# Patient Record
Sex: Male | Born: 1975
Health system: Southern US, Community
[De-identification: ages and names within clinical notes are randomized; demographics above are authoritative.]

## PROBLEM LIST (undated history)

## (undated) DIAGNOSIS — IMO0001 Reserved for inherently not codable concepts without codable children: Secondary | ICD-10-CM

## (undated) DIAGNOSIS — K219 Gastro-esophageal reflux disease without esophagitis: Secondary | ICD-10-CM

## (undated) DIAGNOSIS — R030 Elevated blood-pressure reading, without diagnosis of hypertension: Secondary | ICD-10-CM

## (undated) DIAGNOSIS — E119 Type 2 diabetes mellitus without complications: Secondary | ICD-10-CM

## (undated) DIAGNOSIS — U071 COVID-19: Secondary | ICD-10-CM

## (undated) HISTORY — DX: COVID-19: U07.1

## (undated) HISTORY — PX: NO PAST SURGERIES: SHX2092

---

## 2001-06-09 ENCOUNTER — Encounter: Payer: Self-pay | Admitting: Family Medicine

## 2001-06-09 ENCOUNTER — Ambulatory Visit (HOSPITAL_COMMUNITY): Admission: RE | Admit: 2001-06-09 | Discharge: 2001-06-09 | Payer: Self-pay | Admitting: Family Medicine

## 2003-09-20 ENCOUNTER — Emergency Department (HOSPITAL_COMMUNITY): Admission: EM | Admit: 2003-09-20 | Discharge: 2003-09-20 | Payer: Self-pay | Admitting: Emergency Medicine

## 2008-10-09 ENCOUNTER — Emergency Department (HOSPITAL_BASED_OUTPATIENT_CLINIC_OR_DEPARTMENT_OTHER): Admission: EM | Admit: 2008-10-09 | Discharge: 2008-10-09 | Payer: Self-pay | Admitting: Emergency Medicine

## 2009-11-20 ENCOUNTER — Ambulatory Visit: Payer: Self-pay | Admitting: Occupational Medicine

## 2009-11-20 DIAGNOSIS — K219 Gastro-esophageal reflux disease without esophagitis: Secondary | ICD-10-CM | POA: Insufficient documentation

## 2009-11-20 DIAGNOSIS — R079 Chest pain, unspecified: Secondary | ICD-10-CM | POA: Insufficient documentation

## 2009-11-20 DIAGNOSIS — I10 Essential (primary) hypertension: Secondary | ICD-10-CM | POA: Insufficient documentation

## 2010-05-29 ENCOUNTER — Ambulatory Visit: Payer: Self-pay | Admitting: Family Medicine

## 2010-05-29 DIAGNOSIS — M545 Low back pain: Secondary | ICD-10-CM

## 2011-01-29 NOTE — Letter (Signed)
Summary: Out of Work  [facility]  [address]   [place], [place] [address]   Phone: [phone]  Fax: [phone]    May 29, 2010   Employee:  ADVITH MARTINE [facility]    To Whom It May Concern:   For Medical reasons, the above named employee should avoid lifting (not over 10 pounds), pushing, pulling, and straining for one week.     If you need additional information, please feel free to contact our office.         Sincerely,    Donna Christen MD

## 2011-01-29 NOTE — Assessment & Plan Note (Signed)
Summary: BACK PAIN   Vital Signs:  Patient Profile:   35 Years Old Male CC:      Lower back pain, no trauma, walking across room and felt sharp pain in back Height:     68.5 inches Weight:      158 pounds O2 Sat:      100 % O2 treatment:    Room Air Temp:     98.1 degrees F oral Pulse rate:   80 / minute Pulse rhythm:   regular Resp:     12 per minute BP sitting:   145 / 92  (right arm) Cuff size:   regular  Vitals Entered By: Emilio Math (May 29, 2010 12:30 PM)                  Current Allergies (reviewed today): ! * LATEXHistory of Present Illness Chief Complaint: Lower back pain, no trauma, walking across room and felt sharp pain in back History of Present Illness: Subjective:  Patient complains of low back pain. About 3 weeks ago while sitting in a chair waiting for his car to be repaired, he stood up suddenly and felt sudden lower back pain that did not radiate.  The pain lasted only one day.   One week ago, he stood up suddenly and felt recurrent right low back pain that lasted about 4 hours.  Yesterday while walking on a flat surface he again developed mild low back pain that has peristed.  The pain is better when he walks and sits, worse when standing from a chair.  No bowel or bladder dysfunction.  No saddle numbness.  No past history of back pain.  Feels well.  No weight loss.  No fevers, chills, and sweats   Current Meds NEXIUM 40 MG CPDR (ESOMEPRAZOLE MAGNESIUM)  NAPROXEN 375 MG TABS (NAPROXEN) 1 by mouth q12hr pc  REVIEW OF SYSTEMS Constitutional Symptoms      Denies fever, chills, night sweats, weight loss, weight gain, and fatigue.  Eyes       Denies change in vision, eye pain, eye discharge, glasses, contact lenses, and eye surgery. Ear/Nose/Throat/Mouth       Denies hearing loss/aids, change in hearing, ear pain, ear discharge, dizziness, frequent runny nose, frequent nose bleeds, sinus problems, sore throat, hoarseness, and tooth pain or bleeding.    Respiratory       Denies dry cough, productive cough, wheezing, shortness of breath, asthma, bronchitis, and emphysema/COPD.  Cardiovascular       Denies murmurs, chest pain, and tires easily with exhertion.    Gastrointestinal       Denies stomach pain, nausea/vomiting, diarrhea, constipation, blood in bowel movements, and indigestion. Genitourniary       Denies painful urination, kidney stones, and loss of urinary control. Neurological       Denies paralysis, seizures, and fainting/blackouts. Musculoskeletal       Complains of muscle pain, joint pain, and decreased range of motion.      Denies joint stiffness, redness, swelling, muscle weakness, and gout.  Skin       Denies bruising, unusual mles/lumps or sores, and hair/skin or nail changes.  Psych       Denies mood changes, temper/anger issues, anxiety/stress, speech problems, depression, and sleep problems.  Past History:  Past Medical History: Reviewed history from 11/20/2009 and no changes required. GERD Hypertension  Past Surgical History: Reviewed history from 11/20/2009 and no changes required. Endoscopy 2009  Family History: Reviewed history from 11/20/2009 and no  changes required. Mother,Asthma, HTN Father, HTN, Parkinson's Disease  Social History: Reviewed history from 11/20/2009 and no changes required. Non-smoker Alcohol use-yes Drug use-no Regular exercise-yes IT Manager   Objective:  Appearance:  Patient appears healthy, stated age, and in no acute distress  Eyes:  Pupils are equal, round, and reactive to light and accomdation.  Extraocular movement is intact.  Conjunctivae are not inflamed.  Neck:  No adenopathy Lungs:  Clear to auscultation.  Breath sounds are equal.  Heart:  Regular rate and rhythm without murmurs, rubs, or gallops.  Abdomen:  Nontender without masses or hepatosplenomegaly.  Bowel sounds are present.  No CVA or flank tenderness.   Back:  Full range of motion.  Can heel/toe walk  and squat without difficulty.   Tenderness in the midline at L5-S1.    Straight leg raising test is negative.  Sitting knee extension test is negative.  Strength and sensation in the lower extremities is normal.  Patellar and achilles reflexes are normal.  X-ray LS spine no acute changes X-ray sacrum/coccyx negative Assessment New Problems: BACK PAIN, LUMBAR (ICD-724.2)   Plan New Medications/Changes: NAPROXEN 375 MG TABS (NAPROXEN) 1 by mouth q12hr pc  #20 x 0, 05/29/2010, Donna Christen MD  New Orders: T-Lumbar Spine 2 Views [72100TC] T-Coccyx/Sacrum 2 Views [72220TC] Est. Patient Level III [47829] Planning Comments:   Begin back exercises ([employer] information and instruction patient handout given).  Rx for Naproxen. Follow-up with PCP if not improving.   The patient and/or caregiver has been counseled thoroughly with regard to medications prescribed including dosage, schedule, interactions, rationale for use, and possible side effects and they verbalize understanding.  Diagnoses and expected course of recovery discussed and will return if not improved as expected or if the condition worsens. Patient and/or caregiver verbalized understanding.  Prescriptions: NAPROXEN 375 MG TABS (NAPROXEN) 1 by mouth q12hr pc  #20 x 0   Entered and Authorized by:   Donna Christen MD   Signed by:   Donna Christen MD on 05/29/2010   Method used:   Print then Give to Patient   RxID:   [id number]

## 2012-09-24 ENCOUNTER — Emergency Department
Admission: EM | Admit: 2012-09-24 | Discharge: 2012-09-24 | Disposition: A | Discharge status: Home / Self Care | Payer: BC Managed Care – PPO | Source: Home / Self Care | Attending: Family Medicine | Admitting: Family Medicine

## 2012-09-24 ENCOUNTER — Encounter: Payer: Self-pay | Admitting: *Deleted

## 2012-09-24 DIAGNOSIS — R51 Headache: Secondary | ICD-10-CM

## 2012-09-24 DIAGNOSIS — I10 Essential (primary) hypertension: Secondary | ICD-10-CM

## 2012-09-24 HISTORY — DX: Elevated blood-pressure reading, without diagnosis of hypertension: R03.0

## 2012-09-24 HISTORY — DX: Reserved for inherently not codable concepts without codable children: IMO0001

## 2012-09-24 HISTORY — DX: Gastro-esophageal reflux disease without esophagitis: K21.9

## 2012-09-24 MED ORDER — SUMATRIPTAN 5 MG/ACT NA SOLN: NASAL | Status: DC

## 2012-09-24 NOTE — ED Provider Notes (Signed)
History     CSN: [id number]  Arrival date & time 09/24/12  1034   First MD Initiated Contact with Patient 09/24/12 1048      Chief Complaint  Patient presents with  . Headache     HPI Comments: Patient complains of five day history of recurring lancinating right side headaches.  Each brief pain lasts only about a second, and the episodes of recurring pain last about 30 minutes usually in the morning.  After resolution, he has a vague sore area in his right lateral head that slowly resolves.  The headache episodes sometimes recur later in the day.  No nausea/vomiting.  No changes in vision.  No other neuro symptoms.  The headaches do not awaken him.  He had similar headaches about 4 years ago and a CT scan of head was negative.  His PCP prescribed Imitrex with complete resolution of the headaches. He is assymptomatic at present.  Patient notes a family history of hypertension  Patient is a 36 y.o. male presenting with headaches. The history is provided by the patient.  Headache The primary symptoms include headaches. Primary symptoms do not include syncope, loss of consciousness, altered mental status, seizures, dizziness, visual change, paresthesias, focal weakness, loss of sensation, speech change, memory loss, fever, nausea or vomiting. Episode onset: 5 days ago. The symptoms are unchanged. The neurological symptoms are focal.  The headache is not associated with aura, photophobia, visual change, neck stiffness, paresthesias, weakness or loss of balance.  Additional symptoms include pain and hyperacusis. Additional symptoms do not include neck stiffness, weakness, lower back pain, loss of balance, photophobia, aura, taste disturbance, hearing loss, tinnitus or vertigo. Associated medical issues comments: none. Workup history does not include CT scan.    Past Medical History  Diagnosis Date  . Reflux   . Pre-hypertension     History reviewed. No pertinent past surgical  history.  Family History  Problem Relation Age of Onset  . Hypertension Mother   . Hypertension Father   . Parkinsonism Father     History  Substance Use Topics  . Smoking status: Never Smoker   . Smokeless tobacco: Not on file  . Alcohol Use: Yes      Review of Systems  Constitutional: Negative for fever.  HENT: Negative for hearing loss, neck stiffness and tinnitus.   Eyes: Negative for photophobia.  Cardiovascular: Negative for syncope.  Gastrointestinal: Negative for nausea and vomiting.  Neurological: Positive for headaches. Negative for dizziness, vertigo, speech change, focal weakness, seizures, loss of consciousness, weakness, paresthesias and loss of balance.  Psychiatric/Behavioral: Negative for memory loss and altered mental status.  All other systems reviewed and are negative.    Allergies  Latex and Shellfish allergy  Home Medications  No current outpatient prescriptions on file.  BP 151/103  Pulse 90  Resp 16  Ht 5\' 8"  (1.727 m)  Wt 202 lb (91.627 kg)  BMI 30.71 kg/m2  SpO2 99%  Physical Exam Nursing notes and Vital Signs reviewed. Appearance:  Patient appears healthy, stated age, and in no acute distress Eyes:  Pupils are equal, round, and reactive to light and accomodation.  Extraocular movement is intact.  Conjunctivae are not inflamed.  Fundi are benign.  No photophobia.  Ears:  Canals normal.  Tympanic membranes normal.  Nose:  Mildly congested turbinates.  No sinus tenderness.  Pharynx:  Normal Neck:  Supple.  No adenopathy Lungs:  Clear to auscultation.  Breath sounds are equal.  Heart:  Regular rate and  rhythm without murmurs, rubs, or gallops.  Skin:  No rash present.  Neurologic:  Cranial nerves 2 through 12 are normal.  Patellar, achilles, and elbow reflexes are normal.  Cerebellar function is intact (finger-to-nose and rapid alternating hand movement).  Gait and station are normal.    ED Course  Procedures  none      1.  Unilateral headache; ? Migraine.  Note previous negative CT scan head 4 years ago.  2. Essential hypertension, benign  Chart reviewed:  Note significant weight gain.  On 05/29/10 BP was 158 pounds, and BP was 145/92.      MDM  Trial of Imitrex. Recommend keeping a blood pressure diary.  Discussed exercise, minimize salt, weight loss. Followup with family doctor for blood pressure and headaches.        Lattie Haw, MD 09/24/12 1233

## 2012-09-24 NOTE — ED Notes (Addendum)
Patient c/o intermittent sharp pain on right occipital/temporal region of head x 5 days. The sharp pain only lasts for a few seconds, then resolves completely. He had this same issue that lasted one week about 5 years ago. CT scan done and patient reports was negative. He denies any weakness, dizziness, vision changes or nausea when pain occurs.

## 2012-09-26 ENCOUNTER — Telehealth: Payer: Self-pay

## 2012-09-26 NOTE — ED Notes (Signed)
Left a message on voice mail asking how patient is feeling and advising to call back with any questions or concerns.  

## 2016-01-10 ENCOUNTER — Encounter: Payer: Self-pay | Admitting: *Deleted

## 2016-01-10 ENCOUNTER — Emergency Department
Admission: EM | Admit: 2016-01-10 | Discharge: 2016-01-10 | Disposition: A | Discharge status: Home / Self Care | Payer: Self-pay | Source: Home / Self Care | Attending: Family Medicine | Admitting: Family Medicine

## 2016-01-10 DIAGNOSIS — S0501XA Injury of conjunctiva and corneal abrasion without foreign body, right eye, initial encounter: Secondary | ICD-10-CM

## 2016-01-10 HISTORY — DX: Type 2 diabetes mellitus without complications: E11.9

## 2016-01-10 MED ORDER — POLYMYXIN B-TRIMETHOPRIM 10000-0.1 UNIT/ML-% OP SOLN: 1.0000 [drp] | OPHTHALMIC | Status: DC

## 2016-01-10 NOTE — ED Provider Notes (Signed)
CSN: [id number]     Arrival date & time 01/10/16  1508 History   First MD Initiated Contact with Patient 01/10/16 1602     Chief Complaint  Patient presents with  . Eye Problem      HPI Comments: Patient wear contact lens.  On December 29 he noted redness in his right eye.  After not wearing his contacts for four days the redness resolved.  He resumed wearing his contacts, and six days ago he developed a foreign body sensation in his right eye.  Even though he has not worn his contacts during the past six days he continues to have a slight foreign body sensation, redness, and increased tearing in his right eye.  No changes in vision.  He notes that a cold compress helps.  Patient is a 40 y.o. male presenting with eye problem. The history is provided by the patient.  Eye Problem Location:  R eye Quality:  Foreign body sensation Severity:  Mild Onset quality:  Sudden Duration:  6 days Timing:  Constant Progression:  Unchanged Chronicity:  Recurrent Context: contact lenses   Context: not direct trauma, not foreign body and not scratch   Relieved by: cold compress. Worsened by:  Contact lenses Associated symptoms: foreign body sensation, redness and tearing   Associated symptoms: no blurred vision, no crusting, no decreased vision, no discharge, no double vision, no facial rash, no headaches, no inflammation, no itching, no photophobia, no scotomas and no swelling   Risk factors: not exposed to pinkeye     Past Medical History  Diagnosis Date  . Reflux   . Pre-hypertension   . Diabetes mellitus without complication (HCC)    History reviewed. No pertinent past surgical history. Family History  Problem Relation Age of Onset  . Hypertension Mother   . Hypertension Father   . Parkinsonism Father    Social History  Substance Use Topics  . Smoking status: Never Smoker   . Smokeless tobacco: None  . Alcohol Use: Yes    Review of Systems  Eyes: Positive for redness. Negative for  blurred vision, double vision, photophobia, discharge and itching.  Neurological: Negative for headaches.  All other systems reviewed and are negative.   Allergies  Latex and Shellfish allergy  Home Medications   Prior to Admission medications   Medication Sig Start Date End Date Taking? Authorizing Provider  trimethoprim-polymyxin b (POLYTRIM) ophthalmic solution Place 1 drop into the right eye every 4 (four) hours. 01/10/16   Lattie Haw, MD   Meds Ordered and Administered this Visit  Medications - No data to display  BP 173/111 mmHg  Pulse 95  Temp(Src) 98.2 F (36.8 C) (Oral)  Resp 16  Wt 198 lb (89.812 kg)  SpO2 99% No data found.   Physical Exam  Constitutional: He appears well-developed and well-nourished. No distress.  HENT:  Head: Normocephalic.  Right Ear: External ear normal.  Left Ear: External ear normal.  Nose: Nose normal.  Mouth/Throat: Oropharynx is clear and moist.  Eyes: EOM and lids are normal. Pupils are equal, round, and reactive to light. Lids are everted and swept, no foreign bodies found. Right eye exhibits no discharge, no exudate and no hordeolum. No foreign body present in the right eye. Left eye exhibits no discharge. Right conjunctiva is injected. Right conjunctiva has no hemorrhage.    Fluorescein to the right eye reveals a linear superficial corneal abrasion at position 10 o'clock as noted on diagram.    Lymphadenopathy:  He has no cervical adenopathy.  Nursing note and vitals reviewed.   ED Course  Procedures  none  Visual Acuity Review  Right Eye Distance: 20/25 Left Eye Distance: 20/20 Bilateral Distance: 20/20    MDM   1. Right corneal abrasion, initial encounter    Begin Polytrim ophthalmic suspension Followup with ophthalmologist in two days.    Lattie HawStephen A Beese, MD 01/11/16 [id number]

## 2016-01-10 NOTE — Discharge Instructions (Signed)
Corneal Abrasion °The cornea is the clear covering at the front and center of the eye. When looking at the colored portion of the eye (iris), you are looking through the cornea. This very thin tissue is made up of many layers. The surface layer is a single layer of cells (corneal epithelium) and is one of the most sensitive tissues in the body. If a scratch or injury causes the corneal epithelium to come off, it is called a corneal abrasion. If the injury extends to the tissues below the epithelium, the condition is called a corneal ulcer. °CAUSES  °· Scratches. °· Trauma. °· Foreign body in the eye. °Some people have recurrences of abrasions in the area of the original injury even after it has healed (recurrent erosion syndrome). Recurrent erosion syndrome generally improves and goes away with time. °SYMPTOMS  °· Eye pain. °· Difficulty or inability to keep the injured eye open. °· The eye becomes very sensitive to light. °· Recurrent erosions tend to happen suddenly, first thing in the morning, usually after waking up and opening the eye. °DIAGNOSIS  °Your health care provider can diagnose a corneal abrasion during an eye exam. Dye is usually placed in the eye using a drop or a small paper strip moistened by your tears. When the eye is examined with a special light, the abrasion shows up clearly because of the dye. °TREATMENT  °· Small abrasions may be treated with antibiotic drops or ointment alone. °If the abrasion becomes infected and spreads to the deeper tissues of the cornea, a corneal ulcer can result. This is serious because it can cause corneal scarring. Corneal scars interfere with light passing through the cornea and cause a loss of vision in the involved eye. °HOME CARE INSTRUCTIONS °· Use medicine or ointment as directed. Only take over-the-counter or prescription medicines for pain, discomfort, or fever as directed by your health care provider. °· If your health care provider has given you a  follow-up appointment, it is very important to keep that appointment. Not keeping the appointment could result in a severe eye infection or permanent loss of vision. If there is any problem keeping the appointment, let your health care provider know. °SEEK MEDICAL CARE IF:  °· You have pain, light sensitivity, and a scratchy feeling in one eye or both eyes. °· Any kind of discharge develops from the eye after treatment or if the lids stick together in the morning. °· You have the same symptoms in the morning as you did with the original abrasion days, weeks, or months after the abrasion healed. °  °This information is not intended to replace advice given to you by your health care provider. Make sure you discuss any questions you have with your health care provider. °  °Document Released: 12/13/2000 Document Revised: 09/06/2015 Document Reviewed: 08/23/2013 °Elsevier Interactive Patient Education ©2016 [employer] ° °

## 2016-01-10 NOTE — ED Notes (Signed)
Pt c/o 6 days of right eye redness and irritation. No drainage. Right eye redness developed 12/18/15 and resolved the next day.

## 2019-06-03 ENCOUNTER — Other Ambulatory Visit: Payer: Self-pay

## 2019-06-03 ENCOUNTER — Emergency Department (INDEPENDENT_AMBULATORY_CARE_PROVIDER_SITE_OTHER): Payer: Self-pay

## 2019-06-03 ENCOUNTER — Emergency Department
Admission: EM | Admit: 2019-06-03 | Discharge: 2019-06-03 | Disposition: A | Discharge status: Home / Self Care | Payer: Self-pay | Source: Home / Self Care

## 2019-06-03 DIAGNOSIS — R030 Elevated blood-pressure reading, without diagnosis of hypertension: Secondary | ICD-10-CM

## 2019-06-03 DIAGNOSIS — R000 Tachycardia, unspecified: Secondary | ICD-10-CM

## 2019-06-03 DIAGNOSIS — M940 Chondrocostal junction syndrome [Tietze]: Secondary | ICD-10-CM

## 2019-06-03 DIAGNOSIS — R0989 Other specified symptoms and signs involving the circulatory and respiratory systems: Secondary | ICD-10-CM

## 2019-06-03 DIAGNOSIS — K219 Gastro-esophageal reflux disease without esophagitis: Secondary | ICD-10-CM

## 2019-06-03 LAB — POCT FASTING CBG KUC MANUAL ENTRY: POCT Glucose (KUC): 105 mg/dL — AB (ref 70–99)

## 2019-06-03 NOTE — ED Triage Notes (Signed)
A week ago at work, patient fell over a box and landed flat on his stomach/chest against the floor.  Since has felt pressure/knot in upper abdomen/chest area.

## 2019-06-03 NOTE — Discharge Instructions (Signed)
°  Your EKG showed a slightly elevated heart rate but otherwise reassuring.   Your blood pressure was high throughout your entire visit in urgent care as well as previous visits at this urgent care in the past.  It is strongly advised you discuss this with your primary care provider as you may need to be on at least a low dose of blood pressure medication.  Untreated high blood pressure along with borderline diabetes puts you at an increased risk for early death from heart disease/heart attack, stroke, and even kidney failure.  Please call your family doctor tomorrow to schedule a follow up appointment by early next week for recheck of your symptoms and blood pressure.  Call 911 or have someone drive you to the closest hospital if new or worsening symptoms develop for further evaluation and treatment of symptoms.

## 2019-06-03 NOTE — ED Provider Notes (Addendum)
[facility]    CSN: [id number] Arrival date & time: 06/03/19  1644     History   Chief Complaint Chief Complaint  Patient presents with  . Chest Injury    HPI Robert Glover is a 43 y.o. male.   HPI  Robert Glover is a 43 y.o. male presenting to UC with c/o centralized chest "discomfort" since trip and fall onto laminate floor on May 28th. Mild discomfort does improve with  junior Advil, which he has taken the last 2-3 days. Hx of acid reflux. He has tried OTC GasX and started Nexium today with no relief but states it typically takes 5 days of being on the medication for his indigestion to resolve. Denies chest pain at this time. Denies SOB, diaphoresis or nausea. Pain is not worse with movement. He did have Left elbow and Right knee pain initially from the fall but that pain has resolved.   Pt reports hx of diabetes but he has not gotten his metformin refilled since last winter. Pt states his PCP left the practice but he has seen another PA in the same practice but it has been "a while." he does plan on establishing care with a new PCP. he does monitor his blood sugar at home daily and occasionally after meals. It is never above 140/150 after means and typically between 90-110 prior to meals.  BP elevated in triage. Pt reports hx of "pre-hypertension" but he has never been on medication for his blood pressure.    HR elevated in triage.  Pt reports being anxious at the doctor but notes his HR has been in the 70s the last few days since onset of discomfort.  Pt denies hx of blood clots. Denies calf pain or swelling. No recent travel or surgeries. Denies SOB.  Past Medical History:  Diagnosis Date  . Diabetes mellitus without complication (HCC)   . Pre-hypertension   . Reflux     Patient Active Problem List   Diagnosis Date Noted  . BACK PAIN, LUMBAR 05/29/2010  . HYPERTENSION 11/20/2009  . GERD 11/20/2009  . CHEST PAIN UNSPECIFIED 11/20/2009     History reviewed. No pertinent surgical history.     Home Medications    Prior to Admission medications   Medication Sig Start Date End Date Taking? Authorizing Provider  trimethoprim-polymyxin b (POLYTRIM) ophthalmic solution Place 1 drop into the right eye every 4 (four) hours. 01/10/16   Lattie Haw, MD    Family History Family History  Problem Relation Age of Onset  . Hypertension Mother   . Hypertension Father   . Parkinsonism Father     Social History Social History   Tobacco Use  . Smoking status: Never Smoker  Substance Use Topics  . Alcohol use: Yes  . Drug use: No     Allergies   Patient has no known allergies.   Review of Systems Review of Systems  Constitutional: Negative for chills, diaphoresis, fatigue and fever.  HENT: Negative for congestion, ear pain, sore throat, trouble swallowing and voice change.   Respiratory: Negative for cough, chest tightness and shortness of breath.   Cardiovascular: Positive for chest pain ( "discomfort"). Negative for palpitations.  Gastrointestinal: Negative for abdominal pain, diarrhea, nausea and vomiting.  Musculoskeletal: Negative for arthralgias, back pain and myalgias.  Skin: Negative for rash.     Physical Exam  No data found.  Updated Vital Signs BP (!) 157/106   Pulse 100   Temp 98.3 F (36.8  C) (Tympanic)   Resp 20   Ht 5\' 8"  (1.727 m)   Wt 195 lb (88.5 kg)   SpO2 99%   BMI 29.65 kg/m   Visual Acuity Right Eye Distance:   Left Eye Distance:   Bilateral Distance:    Right Eye Near:   Left Eye Near:    Bilateral Near:     Physical Exam Vitals signs and nursing note reviewed.  Constitutional:      General: He is not in acute distress.    Appearance: Normal appearance. He is well-developed. He is not ill-appearing, toxic-appearing or diaphoretic.  HENT:     Head: Normocephalic and atraumatic.     Nose: Nose normal.     Mouth/Throat:     Mouth: Mucous membranes are moist.  Eyes:      Extraocular Movements: Extraocular movements intact.     Conjunctiva/sclera: Conjunctivae normal.  Neck:     Musculoskeletal: Normal range of motion and neck supple.  Cardiovascular:     Rate and Rhythm: Regular rhythm. Tachycardia present.  Pulmonary:     Effort: Pulmonary effort is normal. No respiratory distress.     Breath sounds: Normal breath sounds. No stridor. No wheezing, rhonchi or rales.  Chest:     Chest wall: Tenderness ( minimal over lower sternum. no crepitus or deformity. ) present.  Abdominal:     General: There is no distension.     Palpations: Abdomen is soft.     Tenderness: There is no abdominal tenderness.  Musculoskeletal: Normal range of motion.  Skin:    General: Skin is warm and dry.     Capillary Refill: Capillary refill takes less than 2 seconds.  Neurological:     Mental Status: He is alert and oriented to person, place, and time.  Psychiatric:        Behavior: Behavior normal.      UC Treatments / Results  Labs (all labs ordered are listed, but only abnormal results are displayed) Labs Reviewed  POCT FASTING CBG [facility] MANUAL ENTRY - Abnormal; Notable for the following components:      Result Value   POCT Glucose ([facility]) 105 (*)    All other components within normal limits    EKG Date/Time:06/03/2019    Ventricular Rate: 107 PR Interval: 116 QRS Duration: 80 QT Interval: 340 QTC Calculation: 453 P-R-T axes: 62   43   13 Text Interpretation: Sinus tachycardia, possible Left atrial enlargement. Borderline ECG.     Radiology Dg Chest 2 View  Result Date: 06/03/2019 CLINICAL DATA:  Fall.  Pressure in chest. EXAM: CHEST - 2 VIEW COMPARISON:  Prior chest x-ray report 04/18/2007. FINDINGS: Mediastinum and hilar structures normal. Lungs are clear. No pleural effusion or pneumothorax. Biapical pleural thickening consistent with scarring. Heart size normal. No acute bony abnormality. IMPRESSION: No acute cardiopulmonary disease. No evidence  of displaced rib fracture. No pneumothorax. Electronically Signed   By: Maisie Fushomas  Register   On: 06/03/2019 17:30    Procedures Procedures (including critical care time)  Medications Ordered in UC Medications - No data to display  Initial Impression / Assessment and Plan / UC Course  I have reviewed the triage vital signs and the nursing notes.  Pertinent labs & imaging results that were available during my care of the patient were reviewed by me and considered in my medical decision making (see chart for details).    While in imaging, pt was asked to reach over his head and take a deep breath.  Pt noted immediately after doing this, he felt relief of the discomfort he was having and notes he wish he had done that earlier.  Reassured pt of relatively unremarkable EKG and CXR Doubt ACS Hx and exam c/w costochondritis and GERD Home care info provided, including stressed importance of BP monitoring and maintenance by PCP (per medical records, pt has had elevated BP in the past while at [facility]) Pt was initially tachycardic but HR improved while in UC. Doubt PE. Pt plans to establish with PCP at [facility]. Discussed symptoms that warrant emergent care in the ED.  Final Clinical Impressions(s) / UC Diagnoses   Final diagnoses:  Costochondritis  Gastroesophageal reflux disease, esophagitis presence not specified  Elevated blood pressure reading  Tachycardia     Discharge Instructions      Your EKG showed a slightly elevated heart rate but otherwise reassuring.   Your blood pressure was high throughout your entire visit in urgent care as well as previous visits at this urgent care in the past.  It is strongly advised you discuss this with your primary care provider as you may need to be on at least a low dose of blood pressure medication.  Untreated high blood pressure along with borderline diabetes puts you at an increased risk for early death from heart  disease/heart attack, stroke, and even kidney failure.  Please call your family doctor tomorrow to schedule a follow up appointment by early next week for recheck of your symptoms and blood pressure.  Call 911 or have someone drive you to the closest hospital if new or worsening symptoms develop for further evaluation and treatment of symptoms.     ED Prescriptions    None     Controlled Substance Prescriptions [place] Controlled Substance Registry consulted? Not Applicable   Rolla Plate 06/03/19 1821    Lurene Shadow, PA-C 06/03/19 2216

## 2019-06-06 ENCOUNTER — Telehealth: Payer: Self-pay

## 2019-06-06 NOTE — Telephone Encounter (Signed)
Left voice message inquiring about patients status. Encouraged patient to call with questions or concerns.  

## 2020-01-21 ENCOUNTER — Ambulatory Visit (INDEPENDENT_AMBULATORY_CARE_PROVIDER_SITE_OTHER): Payer: 59 | Admitting: Medical-Surgical

## 2020-01-21 ENCOUNTER — Encounter: Payer: Self-pay | Admitting: Medical-Surgical

## 2020-01-21 ENCOUNTER — Other Ambulatory Visit: Payer: Self-pay

## 2020-01-21 VITALS — BP 183/108 | HR 98 | Temp 98.6°F | Ht 66.75 in | Wt 182.9 lb

## 2020-01-21 DIAGNOSIS — E119 Type 2 diabetes mellitus without complications: Secondary | ICD-10-CM | POA: Diagnosis not present

## 2020-01-21 DIAGNOSIS — Z7689 Persons encountering health services in other specified circumstances: Secondary | ICD-10-CM | POA: Diagnosis not present

## 2020-01-21 DIAGNOSIS — R030 Elevated blood-pressure reading, without diagnosis of hypertension: Secondary | ICD-10-CM

## 2020-01-21 DIAGNOSIS — Z23 Encounter for immunization: Secondary | ICD-10-CM | POA: Diagnosis not present

## 2020-01-21 DIAGNOSIS — I10 Essential (primary) hypertension: Secondary | ICD-10-CM | POA: Diagnosis not present

## 2020-01-21 MED ORDER — LISINOPRIL-HYDROCHLOROTHIAZIDE 20-25 MG PO TABS: 1.0000 | ORAL_TABLET | Freq: Every day | ORAL | 3 refills | Status: DC

## 2020-01-21 MED ORDER — LISINOPRIL-HYDROCHLOROTHIAZIDE 10-12.5 MG PO TABS: 1.0000 | ORAL_TABLET | Freq: Every day | ORAL | 3 refills | Status: DC

## 2020-01-21 MED ORDER — ATORVASTATIN CALCIUM 20 MG PO TABS: 20.0000 mg | ORAL_TABLET | Freq: Every day | ORAL | 3 refills | Status: DC

## 2020-01-21 NOTE — Assessment & Plan Note (Addendum)
Checking CBC, CMP, and TSH today.  Starting lisinopril-HCTZ 20-25 mg, take one half tab daily x1 week then increase to 1 whole tab.  Encouraged to obtain blood pressure cuff and monitor blood pressure at home.  Information on DASH diet provided.

## 2020-01-21 NOTE — Assessment & Plan Note (Signed)
Checking hemoglobin A1c today.  Starting atorvastatin 20 mg daily.

## 2020-01-21 NOTE — Patient Instructions (Signed)
DASH Eating Plan DASH stands for "Dietary Approaches to Stop Hypertension." The DASH eating plan is a healthy eating plan that has been shown to reduce high blood pressure (hypertension). It may also reduce your risk for type 2 diabetes, heart disease, and stroke. The DASH eating plan may also help with weight loss. What are tips for following this plan?  General guidelines  Avoid eating more than 2,300 mg (milligrams) of salt (sodium) a day. If you have hypertension, you may need to reduce your sodium intake to 1,500 mg a day.  Limit alcohol intake to no more than 1 drink a day for nonpregnant women and 2 drinks a day for men. One drink equals 12 oz of beer, 5 oz of wine, or 1 oz of hard liquor.  Work with your health care provider to maintain a healthy body weight or to lose weight. Ask what an ideal weight is for you.  Get at least 30 minutes of exercise that causes your heart to beat faster (aerobic exercise) most days of the week. Activities may include walking, swimming, or biking.  Work with your health care provider or diet and nutrition specialist (dietitian) to adjust your eating plan to your individual calorie needs. Reading food labels   Check food labels for the amount of sodium per serving. Choose foods with less than 5 percent of the Daily Value of sodium. Generally, foods with less than 300 mg of sodium per serving fit into this eating plan.  To find whole grains, look for the word "whole" as the first word in the ingredient list. Shopping  Buy products labeled as "low-sodium" or "no salt added."  Buy fresh foods. Avoid canned foods and premade or frozen meals. Cooking  Avoid adding salt when cooking. Use salt-free seasonings or herbs instead of table salt or sea salt. Check with your health care provider or pharmacist before using salt substitutes.  Do not fry foods. Cook foods using healthy methods such as baking, boiling, grilling, and broiling instead.  Cook with  heart-healthy oils, such as olive, canola, soybean, or sunflower oil. Meal planning  Eat a balanced diet that includes: ? 5 or more servings of fruits and vegetables each day. At each meal, try to fill half of your plate with fruits and vegetables. ? Up to 6-8 servings of whole grains each day. ? Less than 6 oz of lean meat, poultry, or fish each day. A 3-oz serving of meat is about the same size as a deck of cards. One egg equals 1 oz. ? 2 servings of low-fat dairy each day. ? A serving of nuts, seeds, or beans 5 times each week. ? Heart-healthy fats. Healthy fats called Omega-3 fatty acids are found in foods such as flaxseeds and coldwater fish, like sardines, salmon, and mackerel.  Limit how much you eat of the following: ? Canned or prepackaged foods. ? Food that is high in trans fat, such as fried foods. ? Food that is high in saturated fat, such as fatty meat. ? Sweets, desserts, sugary drinks, and other foods with added sugar. ? Full-fat dairy products.  Do not salt foods before eating.  Try to eat at least 2 vegetarian meals each week.  Eat more home-cooked food and less restaurant, buffet, and fast food.  When eating at a restaurant, ask that your food be prepared with less salt or no salt, if possible. What foods are recommended? The items listed may not be a complete list. Talk with your dietitian about   what dietary choices are best for you. Grains Whole-grain or whole-wheat bread. Whole-grain or whole-wheat pasta. Brown rice. Oatmeal. Quinoa. Bulgur. Whole-grain and low-sodium cereals. Pita bread. Low-fat, low-sodium crackers. Whole-wheat flour tortillas. Vegetables Fresh or frozen vegetables (raw, steamed, roasted, or grilled). Low-sodium or reduced-sodium tomato and vegetable juice. Low-sodium or reduced-sodium tomato sauce and tomato paste. Low-sodium or reduced-sodium canned vegetables. Fruits All fresh, dried, or frozen fruit. Canned fruit in natural juice (without  added sugar). Meat and other protein foods Skinless chicken or turkey. Ground chicken or turkey. Pork with fat trimmed off. Fish and seafood. Egg whites. Dried beans, peas, or lentils. Unsalted nuts, nut butters, and seeds. Unsalted canned beans. Lean cuts of beef with fat trimmed off. Low-sodium, lean deli meat. Dairy Low-fat (1%) or fat-free (skim) milk. Fat-free, low-fat, or reduced-fat cheeses. Nonfat, low-sodium ricotta or cottage cheese. Low-fat or nonfat yogurt. Low-fat, low-sodium cheese. Fats and oils Soft margarine without trans fats. Vegetable oil. Low-fat, reduced-fat, or light mayonnaise and salad dressings (reduced-sodium). Canola, safflower, olive, soybean, and sunflower oils. Avocado. Seasoning and other foods Herbs. Spices. Seasoning mixes without salt. Unsalted popcorn and pretzels. Fat-free sweets. What foods are not recommended? The items listed may not be a complete list. Talk with your dietitian about what dietary choices are best for you. Grains Baked goods made with fat, such as croissants, muffins, or some breads. Dry pasta or rice meal packs. Vegetables Creamed or fried vegetables. Vegetables in a cheese sauce. Regular canned vegetables (not low-sodium or reduced-sodium). Regular canned tomato sauce and paste (not low-sodium or reduced-sodium). Regular tomato and vegetable juice (not low-sodium or reduced-sodium). Pickles. Olives. Fruits Canned fruit in a light or heavy syrup. Fried fruit. Fruit in cream or butter sauce. Meat and other protein foods Fatty cuts of meat. Ribs. Fried meat. Bacon. Sausage. Bologna and other processed lunch meats. Salami. Fatback. Hotdogs. Bratwurst. Salted nuts and seeds. Canned beans with added salt. Canned or smoked fish. Whole eggs or egg yolks. Chicken or turkey with skin. Dairy Whole or 2% milk, cream, and half-and-half. Whole or full-fat cream cheese. Whole-fat or sweetened yogurt. Full-fat cheese. Nondairy creamers. Whipped toppings.  Processed cheese and cheese spreads. Fats and oils Butter. Stick margarine. Lard. Shortening. Ghee. Bacon fat. Tropical oils, such as coconut, palm kernel, or palm oil. Seasoning and other foods Salted popcorn and pretzels. Onion salt, garlic salt, seasoned salt, table salt, and sea salt. Worcestershire sauce. Tartar sauce. Barbecue sauce. Teriyaki sauce. Soy sauce, including reduced-sodium. Steak sauce. Canned and packaged gravies. Fish sauce. Oyster sauce. Cocktail sauce. Horseradish that you find on the shelf. Ketchup. Mustard. Meat flavorings and tenderizers. Bouillon cubes. Hot sauce and Tabasco sauce. Premade or packaged marinades. Premade or packaged taco seasonings. Relishes. Regular salad dressings. Where to find more information:  National Heart, Lung, and Blood Institute: www.nhlbi.nih.gov  American Heart Association: www.heart.org Summary  The DASH eating plan is a healthy eating plan that has been shown to reduce high blood pressure (hypertension). It may also reduce your risk for type 2 diabetes, heart disease, and stroke.  With the DASH eating plan, you should limit salt (sodium) intake to 2,300 mg a day. If you have hypertension, you may need to reduce your sodium intake to 1,500 mg a day.  When on the DASH eating plan, aim to eat more fresh fruits and vegetables, whole grains, lean proteins, low-fat dairy, and heart-healthy fats.  Work with your health care provider or diet and nutrition specialist (dietitian) to adjust your eating plan to your   individual calorie needs. This information is not intended to replace advice given to you by your health care provider. Make sure you discuss any questions you have with your health care provider. Document Revised: 11/28/2017 Document Reviewed: 12/09/2016 Elsevier Patient Education  2020 Elsevier Inc.  

## 2020-01-21 NOTE — Progress Notes (Addendum)
New Patient Office Visit  Subjective:  Patient ID: Robert Glover, male    DOB: 05/05/1976  Age: 44 y.o. MRN: [id number]  CC:  Chief Complaint  Patient presents with  . Establish Care  . Diabetes  . Elevated Blood Pressure withou Dx of HTN    HPI Robert Glover is a pleasant 44 year old male presenting today to establish care. No primary care management for at least 5 years.  Has significant difficulty swallowing pills.  Either uses children's formula Tylenol/ibuprofen or if unavailable in liquid form, uses apple babyfood to swallow smaller pills.  History of diabetes, managed currently by diet.  Checking blood sugars 1-2 times per day, over the last week ranging between 90s and 155.  Previously tried to take Metformin but the pill was too big and he could not swallow it.  Avoids concentrated sweets and tries to be conscientious with his dietary choices.  Usually exercises regularly when the weather is good.  Decreased exercise recently due to the cold temperatures.  No previous ACE inhibitor or statin therapy.  Blood pressure 206/128 today.  Recheck after appointment completion was 183/119.  Has been told in the past that his blood pressure was elevated but was never treated for hypertension.  Denies chest pain, shortness of breath, cavitations, lower extremity edema, headaches, and lightheadedness.  Feels that he has significant whitecoat syndrome and doctors offices make him very nervous.  Reports he has had chest soreness over the sternum and lower ribs intermittently for several weeks.  Admits to digging with a shovel to bury his cat with the soreness presenting the next morning.  Has taken Advil which helps relieve the discomfort.  Past Medical History:  Diagnosis Date  . COVID-19   . Diabetes mellitus without complication (HCC)   . Pre-hypertension   . Reflux     Past Surgical History:  Procedure Laterality Date  . NO PAST SURGERIES      Family History  Problem  Relation Age of Onset  . Hypertension Mother   . Hypertension Father   . Parkinsonism Father   . Cancer Neg Hx   . COPD Neg Hx   . Depression Neg Hx   . Hyperlipidemia Neg Hx   . Kidney disease Neg Hx   . Stroke Neg Hx     Social History   Socioeconomic History  . Marital status: Single    Spouse name: Not on file  . Number of children: Not on file  . Years of education: Not on file  . Highest education level: Not on file  Occupational History  . Occupation: Automotive engineer  Tobacco Use  . Smoking status: Never Smoker  . Smokeless tobacco: Never Used  Substance and Sexual Activity  . Alcohol use: Yes    Comment: Rarely  . Drug use: No  . Sexual activity: Never  Other Topics Concern  . Not on file  Social History Narrative  . Not on file   Social Determinants of Health   Financial Resource Strain:   . Difficulty of Paying Living Expenses: Not on file  Food Insecurity:   . Worried About Programme researcher, broadcasting/film/video in the Last Year: Not on file  . Ran Out of Food in the Last Year: Not on file  Transportation Needs:   . Lack of Transportation (Medical): Not on file  . Lack of Transportation (Non-Medical): Not on file  Physical Activity:   . Days of Exercise per Week: Not on file  . Minutes of  Exercise per Session: Not on file  Stress:   . Feeling of Stress : Not on file  Social Connections:   . Frequency of Communication with Friends and Family: Not on file  . Frequency of Social Gatherings with Friends and Family: Not on file  . Attends Religious Services: Not on file  . Active Member of Clubs or Organizations: Not on file  . Attends Banker Meetings: Not on file  . Marital Status: Not on file  Intimate Partner Violence:   . Fear of Current or Ex-Partner: Not on file  . Emotionally Abused: Not on file  . Physically Abused: Not on file  . Sexually Abused: Not on file    ROS Review of Systems  Constitutional: Negative for chills, fatigue and fever.   Respiratory: Negative for cough, chest tightness and shortness of breath.   Cardiovascular: Negative for chest pain, palpitations and leg swelling.  Gastrointestinal: Negative for abdominal pain, constipation, diarrhea and nausea.  Endocrine: Negative for polydipsia, polyphagia and polyuria.  Neurological: Negative for dizziness, weakness and light-headedness.  Psychiatric/Behavioral: Negative for decreased concentration and suicidal ideas. The patient is nervous/anxious.     Objective:   Today's Vitals: BP (!) 183/108   Pulse 98   Temp 98.6 F (37 C) (Oral)   Ht 5' 6.75" (1.695 m)   Wt 182 lb 14.4 oz (83 kg)   SpO2 100%   BMI 28.86 kg/m   Physical Exam Constitutional:      General: He is not in acute distress.    Appearance: Normal appearance.  HENT:     Head: Normocephalic and atraumatic.  Cardiovascular:     Rate and Rhythm: Normal rate and regular rhythm.     Pulses: Normal pulses.     Heart sounds: No murmur. No friction rub. No gallop.   Pulmonary:     Effort: Pulmonary effort is normal. No respiratory distress.     Breath sounds: Normal breath sounds.  Musculoskeletal:        General: Tenderness (Mid/lower sternum) present.  Skin:    General: Skin is warm and dry.  Neurological:     Mental Status: He is alert and oriented to person, place, and time.  Psychiatric:        Mood and Affect: Mood normal.        Behavior: Behavior normal.        Thought Content: Thought content normal.        Judgment: Judgment normal.     Assessment & Plan:   Problem List Items Addressed This Visit      Endocrine   Diabetes mellitus without complication (HCC)   Relevant Medications   atorvastatin (LIPITOR) 20 MG tablet   lisinopril-hydrochlorothiazide (ZESTORETIC) 20-25 MG tablet   Other Relevant Orders   CBC   CMP and Liver   TSH   HgB A1c     Other   Elevated blood pressure reading   Relevant Medications   lisinopril-hydrochlorothiazide (ZESTORETIC) 20-25 MG  tablet   Encounter to establish care - Primary     Costochondritis May take ibuprofen as needed.  Avoid activities that exacerbate discomfort.   Outpatient Encounter Medications as of 01/21/2020  Medication Sig  . esomeprazole (NEXIUM) 20 MG capsule Take 20 mg by mouth daily as needed.  Marland Kitchen atorvastatin (LIPITOR) 20 MG tablet Take 1 tablet (20 mg total) by mouth daily.  Marland Kitchen lisinopril-hydrochlorothiazide (ZESTORETIC) 20-25 MG tablet Take 1 tablet by mouth daily. Take 1/2 tab daily for 1 week  then increase to 1 tab daily.  . [DISCONTINUED] lisinopril-hydrochlorothiazide (ZESTORETIC) 10-12.5 MG tablet Take 1 tablet by mouth daily.  . [DISCONTINUED] lisinopril-hydrochlorothiazide (ZESTORETIC) 20-25 MG tablet Take 1 tablet by mouth daily. Take 1/2 tab daily for 1 week then increase to 1 tab daily.  . [DISCONTINUED] trimethoprim-polymyxin b (POLYTRIM) ophthalmic solution Place 1 drop into the right eye every 4 (four) hours.   No facility-administered encounter medications on file as of 01/21/2020.    Follow-up: Return in about 2 weeks (around 02/04/2020) for nurse visit for BP check.   Samuel Bouche, NP

## 2020-01-25 LAB — HEMOGLOBIN A1C

## 2020-01-25 LAB — TSH

## 2020-01-25 LAB — CBC

## 2020-01-25 LAB — CP 307569

## 2020-02-04 ENCOUNTER — Ambulatory Visit (INDEPENDENT_AMBULATORY_CARE_PROVIDER_SITE_OTHER): Payer: 59 | Admitting: Medical-Surgical

## 2020-02-04 ENCOUNTER — Other Ambulatory Visit: Payer: Self-pay

## 2020-02-04 VITALS — BP 173/119 | HR 92 | Temp 98.0°F | Wt 184.1 lb

## 2020-02-04 DIAGNOSIS — I10 Essential (primary) hypertension: Secondary | ICD-10-CM | POA: Diagnosis not present

## 2020-02-04 NOTE — Progress Notes (Signed)
Patient is here for a blood pressure check. Denies chest pains, palpitations, dizziness, lightheadedness or SOB. Patient currently taking blood pressure medications as instructed. Patient mentioned during blood pressure reading he has white coat syndrome. As per patient, blood pressure readings at home has been ranging from upper 130's/80's. Last at home check was yesterday - 135/83. Per provider's instruction, patient aware to make an appt in 2 weeks for blood pressure check. Patient will purchase a machine and will record bp readings every other day. Aware to bring in blood readings and machine at next nurse visit.

## 2020-02-18 ENCOUNTER — Ambulatory Visit (INDEPENDENT_AMBULATORY_CARE_PROVIDER_SITE_OTHER): Payer: 59 | Admitting: Nurse Practitioner

## 2020-02-18 ENCOUNTER — Other Ambulatory Visit: Payer: Self-pay

## 2020-02-18 ENCOUNTER — Other Ambulatory Visit: Payer: Self-pay | Admitting: Nurse Practitioner

## 2020-02-18 VITALS — BP 158/94 | HR 89 | Wt 183.1 lb

## 2020-02-18 DIAGNOSIS — I10 Essential (primary) hypertension: Secondary | ICD-10-CM

## 2020-02-18 DIAGNOSIS — E119 Type 2 diabetes mellitus without complications: Secondary | ICD-10-CM

## 2020-02-18 MED ORDER — LISINOPRIL-HYDROCHLOROTHIAZIDE 20-12.5 MG PO TABS: 2.0000 | ORAL_TABLET | Freq: Every day | ORAL | 1 refills | Status: DC

## 2020-02-18 NOTE — Progress Notes (Signed)
Pt is here for a NV blood pressure check. Denies chest pain, palpitations, SOB, lightheadedness, ha's, mood or medication problems. Initial blood pressure reading was 175/108, pulse 91. Pt presented with personal blood pressure monitor along with copies of home blood pressure readings. Blood pressure reading with pt's machine was 167/100, pulse 92. Pt sat for 15 minutes. Recheck reading was 158/94, pulse 89. Covering provider informed of blood pressure readings. Covering provider has made adjustments to pt's blood pressure medication. Pt is aware of changes. Pt informed to continue with home blood pressure checks and to follow up with nurse for blood pressure check with new dose. Pt was agreeable with plan.

## 2020-03-03 ENCOUNTER — Other Ambulatory Visit: Payer: Self-pay

## 2020-03-03 ENCOUNTER — Ambulatory Visit (INDEPENDENT_AMBULATORY_CARE_PROVIDER_SITE_OTHER): Payer: 59 | Admitting: Medical-Surgical

## 2020-03-03 VITALS — BP 137/89 | HR 83 | Wt 183.0 lb

## 2020-03-03 DIAGNOSIS — I10 Essential (primary) hypertension: Secondary | ICD-10-CM | POA: Diagnosis not present

## 2020-03-03 NOTE — Progress Notes (Signed)
   Subjective:    Patient ID: Robert Glover, male    DOB: [birth year], 44 y.o.   MRN: [id number]  HPI Patient here for recheck on BP.  Home Bp readings are as follows:  134/76 128/75 133/75 130/72 135/71 No complaints of headaches, dizziness, blurred vision, chest pain, palpitations or shortness of breath. KG LPN  Review of Systems     Objective:   Physical Exam        Assessment & Plan:  Continue current medication and follow up with office visit in 2 months or sooner if blood pressures start to rise. KG LPN

## 2020-03-04 ENCOUNTER — Encounter: Payer: Self-pay | Admitting: Nurse Practitioner

## 2020-03-06 ENCOUNTER — Other Ambulatory Visit: Payer: Self-pay

## 2020-03-06 DIAGNOSIS — I10 Essential (primary) hypertension: Secondary | ICD-10-CM

## 2020-03-06 MED ORDER — LISINOPRIL-HYDROCHLOROTHIAZIDE 20-12.5 MG PO TABS: 2.0000 | ORAL_TABLET | Freq: Every day | ORAL | 1 refills | Status: DC

## 2020-04-10 ENCOUNTER — Other Ambulatory Visit: Payer: Self-pay

## 2020-04-10 ENCOUNTER — Emergency Department
Admission: EM | Admit: 2020-04-10 | Discharge: 2020-04-10 | Disposition: A | Discharge status: Home / Self Care | Payer: 59 | Source: Home / Self Care

## 2020-04-10 DIAGNOSIS — K625 Hemorrhage of anus and rectum: Secondary | ICD-10-CM | POA: Diagnosis not present

## 2020-04-10 DIAGNOSIS — K648 Other hemorrhoids: Secondary | ICD-10-CM

## 2020-04-10 NOTE — ED Triage Notes (Signed)
Patient presents to Urgent Care with complaints of small amount of blood with his stool since this morning. Patient reports he had a large BM and that started the bleeding, which has been intermittent since the BM. Pt describes the blood as a light shade of red.

## 2020-04-10 NOTE — ED Provider Notes (Signed)
[name]    CSN: [id number] Arrival date & time: 04/10/20  1359      History   Chief Complaint Chief Complaint  Patient presents with  . Rectal Bleeding    HPI Robert Glover is a 44 y.o. male.   HPI Robert Glover is a 44 y.o. male presenting to UC with c/o rectal bleeding with a small amount of bright red blood after having a "large" bowel movement this morning.  He reports continuing to have a small amount of red blood when he wiped every 30 minutes this morning while at work but states the blood was never so much that it got onto his clothing.  Hx of similar symptoms after larger BMs but states the bleeding is usually just after the BM and not continuous after the initial wipe.   Denies fever, chills, n/v/d. No abdominal pain.  No known family hx of colon cancer. Pt f/u routinely with his PCP for ongoing healthcare needs including HTN.    Past Medical History:  Diagnosis Date  . COVID-19   . Diabetes mellitus without complication (HCC)   . Pre-hypertension   . Reflux     Patient Active Problem List   Diagnosis Date Noted  . Diabetes mellitus without complication (HCC) 01/21/2020  . BACK PAIN, LUMBAR 05/29/2010  . Essential hypertension 11/20/2009  . GERD 11/20/2009  . CHEST PAIN UNSPECIFIED 11/20/2009    Past Surgical History:  Procedure Laterality Date  . NO PAST SURGERIES         Home Medications    Prior to Admission medications   Medication Sig Start Date End Date Taking? Authorizing Provider  atorvastatin (LIPITOR) 20 MG tablet Take 1 tablet (20 mg total) by mouth daily. 01/21/20  Yes Christen Butter, NP  lisinopril-hydrochlorothiazide (ZESTORETIC) 20-12.5 MG tablet Take 2 tablets by mouth daily. For blood pressure 03/06/20  Yes Jessup, Joy, NP  esomeprazole (NEXIUM) 20 MG capsule Take 20 mg by mouth daily as needed.    [provider]    Family History Family History  Problem Relation Age of Onset  . Hypertension  Mother   . Hypertension Father   . Parkinsonism Father   . Cancer Neg Hx   . COPD Neg Hx   . Depression Neg Hx   . Hyperlipidemia Neg Hx   . Kidney disease Neg Hx   . Stroke Neg Hx     Social History Social History   Tobacco Use  . Smoking status: Never Smoker  . Smokeless tobacco: Never Used  Substance Use Topics  . Alcohol use: Yes    Comment: Rarely  . Drug use: No     Allergies   Latex   Review of Systems Review of Systems  Constitutional: Negative for chills and fever.  Gastrointestinal: Positive for anal bleeding and blood in stool. Negative for abdominal distention, abdominal pain, constipation, diarrhea, nausea, rectal pain and vomiting.  Genitourinary: Negative for dysuria, flank pain, frequency and hematuria.  Neurological: Negative for dizziness and headaches.     Physical Exam Triage Vital Signs ED Triage Vitals  Enc Vitals Group     BP 04/10/20 1422 (!) 152/94     Pulse Rate 04/10/20 1422 94     Resp 04/10/20 1422 16     Temp 04/10/20 1422 98.8 F (37.1 C)     Temp Source 04/10/20 1422 Oral     SpO2 04/10/20 1422 99 %     Weight --  Height --      Head Circumference --      Peak Flow --      Pain Score 04/10/20 1421 0     Pain Loc --      Pain Edu? --      Excl. in [place]? --    No data found.  Updated Vital Signs BP (!) 152/94 (BP Location: Right Arm)   Pulse 94   Temp 98.8 F (37.1 C) (Oral)   Resp 16   SpO2 99%   Visual Acuity Right Eye Distance:   Left Eye Distance:   Bilateral Distance:    Right Eye Near:   Left Eye Near:    Bilateral Near:     Physical Exam Vitals and nursing note reviewed. Exam conducted with a chaperone present.  Constitutional:      Appearance: Normal appearance. He is well-developed.  HENT:     Head: Normocephalic and atraumatic.  Cardiovascular:     Rate and Rhythm: Normal rate.  Pulmonary:     Effort: Pulmonary effort is normal.  Genitourinary:    Rectum: Guaiac result positive. Internal  hemorrhoid present. No tenderness, anal fissure or external hemorrhoid. Normal anal tone.     Comments: Small amount of dried red blood on buttock c/w prior wiping of blood. No visible active bleeding.  Musculoskeletal:        General: Normal range of motion.     Cervical back: Normal range of motion.  Skin:    General: Skin is warm and dry.  Neurological:     Mental Status: He is alert and oriented to person, place, and time.  Psychiatric:        Behavior: Behavior normal.      UC Treatments / Results  Labs (all labs ordered are listed, but only abnormal results are displayed) Labs Reviewed - No data to display  EKG   Radiology No results found.  Procedures Procedures (including critical care time)  Medications Ordered in UC Medications - No data to display  Initial Impression / Assessment and Plan / UC Course  I have reviewed the triage vital signs and the nursing notes.  Pertinent labs & imaging results that were available during my care of the patient were reviewed by me and considered in my medical decision making (see chart for details).     Small amount of dried blood noted on exam. Small internal hemorrhoid palpated on exam No evidence of emergent process taking place at this time. Pt reports having rectal suppositories he's used in the past for hemorrhoids, advised he can try these today. Encouraged f/u with PCP or GI specialist if symptoms continue.   Pt plans to go to [facility] for a week starting Saturday, 4/17.  Discussed symptoms that warrant emergent care in the ED. AVS provided  Final Clinical Impressions(s) / UC Diagnoses   Final diagnoses:  Rectal bleeding  Internal hemorrhoid     Discharge Instructions      It sounds as if your bleeding has resolved/nearly resolved.  You may try your home suppositories for a small internal hemorrhoid felt on exam.  Be sure to monitor the bleeding. If it persists this week or resolves and keeps returning over  the next few days or weeks, please follow up with your family doctor who would be able to schedule a colonoscopy or call to schedule a follow up appointment with [facility] for further evaluation and treatment of your symptoms.  Call 911 or go to  the hospital if symptoms worsening- worsening bleeding, abdominal pain, dizziness, passing out or other new concerning symptoms develop.    ED Prescriptions    None     PDMP not reviewed this encounter.   Lurene Shadow, [place] 04/11/20 [phone]

## 2020-04-10 NOTE — Discharge Instructions (Addendum)
  It sounds as if your bleeding has resolved/nearly resolved.  You may try your home suppositories for a small internal hemorrhoid felt on exam.  Be sure to monitor the bleeding. If it persists this week or resolves and keeps returning over the next few days or weeks, please follow up with your family doctor who would be able to schedule a colonoscopy or call to schedule a follow up appointment with [facility] for further evaluation and treatment of your symptoms.  Call 911 or go to the hospital if symptoms worsening- worsening bleeding, abdominal pain, dizziness, passing out or other new concerning symptoms develop.

## 2020-04-28 ENCOUNTER — Other Ambulatory Visit: Payer: Self-pay

## 2020-04-28 ENCOUNTER — Encounter: Payer: Self-pay | Admitting: Medical-Surgical

## 2020-04-28 ENCOUNTER — Ambulatory Visit (INDEPENDENT_AMBULATORY_CARE_PROVIDER_SITE_OTHER): Payer: 59 | Admitting: Medical-Surgical

## 2020-04-28 VITALS — BP 133/88 | HR 84 | Temp 97.9°F | Ht 66.75 in | Wt 183.5 lb

## 2020-04-28 DIAGNOSIS — I10 Essential (primary) hypertension: Secondary | ICD-10-CM | POA: Diagnosis not present

## 2020-04-28 DIAGNOSIS — E119 Type 2 diabetes mellitus without complications: Secondary | ICD-10-CM | POA: Diagnosis not present

## 2020-04-28 DIAGNOSIS — Z23 Encounter for immunization: Secondary | ICD-10-CM

## 2020-04-28 MED ORDER — LISINOPRIL-HYDROCHLOROTHIAZIDE 20-12.5 MG PO TABS: 2.0000 | ORAL_TABLET | Freq: Every day | ORAL | 1 refills | Status: DC

## 2020-04-28 NOTE — Progress Notes (Signed)
Subjective:    CC: HTN/diabetes/GERD follow up  HPI: Very pleasant 44 year old male presenting today for follow-up on hypertension, diabetes, and GERD.  Hypertension-taking lisinopril-HCTZ 40-25 mg daily.  Tolerating medication well with no side effects.  Reports he has been more active and is walking approximately 3 miles per day.  Working on dietary modifications but he is drinking more water.  Checking blood pressures at home regularly.  Range is 117-138/71-89 with most readings being in the 120s/70s.  Denies chest pain, shortness of breath, lower extremity edema, palpitations, dizziness.  Notes that he feels much better on this new dose of his medication and that he has noticed that he is to have intermittent headaches but has not had one in several months.  Diabetes-checking sugars regularly.  Reports his last blood sugar was checked 2 and half hours after eating with a reading of 105.  Does not take medication at this time, managed with diet and exercise.  GERD-has not taken his esomeprazole since starting the blood pressure medication because he saw that esomeprazole may interfere with the absorption of his lisinopril-HCTZ.  Reports an episode a couple of days ago when he was eating where it felt as if he had throat fullness after swallowing his food.  He was able to breathe fine but he felt the pressure as if he was choking for about 30 minutes before it began to spontaneously resolve.  Denies burning in the chest but reports he feels as if this is related to his indigestion and heartburn.   I reviewed the past medical history, family history, social history, surgical history, and allergies today and no changes were needed.  Please see the problem list section below in epic for further details.  Past Medical History: Past Medical History:  Diagnosis Date  . COVID-19   . Diabetes mellitus without complication ([place])   . Pre-hypertension   . Reflux    Past Surgical History: Past  Surgical History:  Procedure Laterality Date  . NO PAST SURGERIES     Social History: Social History   Socioeconomic History  . Marital status: Single    Spouse name: Not on file  . Number of children: Not on file  . Years of education: Not on file  . Highest education level: Not on file  Occupational History  . Occupation: Child psychotherapist  Tobacco Use  . Smoking status: Never Smoker  . Smokeless tobacco: Never Used  Substance and Sexual Activity  . Alcohol use: Yes    Comment: Rarely  . Drug use: No  . Sexual activity: Never  Other Topics Concern  . Not on file  Social History Narrative  . Not on file   Social Determinants of Health   Financial Resource Strain:   . Difficulty of Paying Living Expenses:   Food Insecurity:   . Worried About Charity fundraiser in the Last Year:   . Arboriculturist in the Last Year:   Transportation Needs:   . Film/video editor (Medical):   Marland Kitchen Lack of Transportation (Non-Medical):   Physical Activity:   . Days of Exercise per Week:   . Minutes of Exercise per Session:   Stress:   . Feeling of Stress :   Social Connections:   . Frequency of Communication with Friends and Family:   . Frequency of Social Gatherings with Friends and Family:   . Attends Religious Services:   . Active Member of Clubs or Organizations:   . Attends Club or  Organization Meetings:   Marland Kitchen Marital Status:    Family History: Family History  Problem Relation Age of Onset  . Hypertension Mother   . Hypertension Father   . Parkinsonism Father   . Cancer Neg Hx   . COPD Neg Hx   . Depression Neg Hx   . Hyperlipidemia Neg Hx   . Kidney disease Neg Hx   . Stroke Neg Hx    Allergies: Allergies  Allergen Reactions  . Latex Rash   Medications: See med rec.  Review of Systems: No fevers, chills, night sweats, weight loss, chest pain, or shortness of breath.   Objective:    General: Well Developed, well nourished, and in no acute distress.   Neuro: Alert and oriented x.  HEENT: Normocephalic, atraumatic.  Skin: Warm and dry. Cardiac: Regular rate and rhythm, no murmurs rubs or gallops, no lower extremity edema.  Respiratory: Clear to auscultation bilaterally. Not using accessory muscles, speaking in full sentences. Abdomen: Soft, nontender, nondistended.  Bowel sounds positive.   Impression and Recommendations:    1. Need for pneumococcal vaccination Elevated risk, due for pneumococcal vaccine.  Vaccination given in office by MA. - Pneumococcal polysaccharide vaccine 23-valent greater than or equal to 2yo subcutaneous/IM  2. Essential hypertension Continue lisinopril-HCTZ 20-12.5 mg 2 tablets daily.  On review, noted that lab work collected in January was canceled.  Checking CBC, CMP, lipid panel today. - lisinopril-hydrochlorothiazide (ZESTORETIC) 20-12.5 MG tablet; Take 2 tablets by mouth daily. For blood pressure  Dispense: 180 tablet; Refill: 1  3. Diabetes mellitus without complication (HCC) Continue lifestyle modifications for management of diabetes.  Checking hemoglobin A1c today.  Return in about 3 months (around 07/28/2020) for DM/HTN follow up. ___________________________________________ Thayer Ohm, DNP, APRN, FNP-BC Primary Care and Sports Medicine [facility]

## 2020-04-29 LAB — COMPLETE METABOLIC PANEL WITH GFR
AG Ratio: 2.1 (calc) (ref 1.0–2.5)
ALT: 36 U/L (ref 9–46)
AST: 22 U/L (ref 10–40)
Albumin: 4.7 g/dL (ref 3.6–5.1)
Alkaline phosphatase (APISO): 46 U/L (ref 36–130)
BUN: 16 mg/dL (ref 7–25)
CO2: 29 mmol/L (ref 20–32)
Calcium: 9.6 mg/dL (ref 8.6–10.3)
Chloride: 101 mmol/L (ref 98–110)
Creat: 1.34 mg/dL (ref 0.60–1.35)
GFR, Est African American: 75 mL/min/{1.73_m2} (ref >=60)
GFR, Est Non African American: 64 mL/min/{1.73_m2} (ref >=60)
Globulin: 2.2 g/dL (calc) (ref 1.9–3.7)
Glucose, Bld: 65 mg/dL (ref 65–99)
Potassium: 4.2 mmol/L (ref 3.5–5.3)
Sodium: 141 mmol/L (ref 135–146)
Total Bilirubin: 0.8 mg/dL (ref 0.2–1.2)
Total Protein: 6.9 g/dL (ref 6.1–8.1)

## 2020-04-29 LAB — LIPID PANEL
Cholesterol: 127 mg/dL (ref <200)
HDL: 38 mg/dL — ABNORMAL LOW (ref >=40)
LDL Cholesterol (Calc): 72 mg/dL (calc)
Non-HDL Cholesterol (Calc): 89 mg/dL (calc) (ref <130)
Total CHOL/HDL Ratio: 3.3 (calc) (ref <5.0)
Triglycerides: 91 mg/dL (ref <150)

## 2020-04-29 LAB — HEMOGLOBIN A1C
Hgb A1c MFr Bld: 5.8 % of total Hgb — ABNORMAL HIGH (ref <5.7)
Mean Plasma Glucose: 120 (calc)
eAG (mmol/L): 6.6 (calc)

## 2020-04-29 LAB — CBC
HCT: 44.4 % (ref 38.5–50.0)
Hemoglobin: 15.6 g/dL (ref 13.2–17.1)
MCH: 30.8 pg (ref 27.0–33.0)
MCHC: 35.1 g/dL (ref 32.0–36.0)
MCV: 87.6 fL (ref 80.0–100.0)
MPV: 10.7 fL (ref 7.5–12.5)
Platelets: 238 10*3/uL (ref 140–400)
RBC: 5.07 10*6/uL (ref 4.20–5.80)
RDW: 13.6 % (ref 11.0–15.0)
WBC: 5.9 10*3/uL (ref 3.8–10.8)

## 2020-04-29 LAB — SPECIMEN COMPROMISED

## 2020-05-15 ENCOUNTER — Encounter: Payer: Self-pay | Admitting: Emergency Medicine

## 2020-05-15 ENCOUNTER — Emergency Department (INDEPENDENT_AMBULATORY_CARE_PROVIDER_SITE_OTHER): Payer: 59

## 2020-05-15 ENCOUNTER — Other Ambulatory Visit: Payer: Self-pay

## 2020-05-15 ENCOUNTER — Emergency Department (INDEPENDENT_AMBULATORY_CARE_PROVIDER_SITE_OTHER)
Admission: EM | Admit: 2020-05-15 | Discharge: 2020-05-15 | Disposition: A | Discharge status: Home / Self Care | Payer: 59 | Source: Home / Self Care | Attending: Family Medicine | Admitting: Family Medicine

## 2020-05-15 DIAGNOSIS — M940 Chondrocostal junction syndrome [Tietze]: Secondary | ICD-10-CM

## 2020-05-15 DIAGNOSIS — R0789 Other chest pain: Secondary | ICD-10-CM

## 2020-05-15 DIAGNOSIS — R071 Chest pain on breathing: Secondary | ICD-10-CM | POA: Diagnosis not present

## 2020-05-15 NOTE — Discharge Instructions (Addendum)
Apply ice pack for 20 to 30 minutes, 3 to 4 times daily  Continue until pain decreases.  May take Ibuprofen 200mg, 4 tabs every 8 hours with food.  

## 2020-05-15 NOTE — ED Triage Notes (Signed)
Intermittent left side chest pain x 1 week

## 2020-05-15 NOTE — ED Provider Notes (Signed)
[name]    CSN: [id number] Arrival date & time: 05/15/20  1140      History   Chief Complaint Chief Complaint  Patient presents with  . Chest Pain    HPI Robert Glover is a 44 y.o. male.   About one week ago patient developed intermittent sharp left upper chest pain.  The pain does not radiate and is worse with chest movement and inspiration.  He denies cough, recent URI, shortness of breath, and recent injury to his chest.  He has a history of GERD for which he takes Nexium, but those symptoms are different.   Chest Pain Pain location:  L chest Pain quality: sharp and stabbing   Pain radiates to:  Does not radiate Pain severity:  Mild Onset quality:  Sudden Duration:  1 week Timing:  Sporadic Progression:  Unchanged Chronicity:  New Context: breathing, lifting, movement and at rest   Relieved by:  None tried Worsened by:  Certain positions, deep breathing and movement Ineffective treatments:  None tried Associated symptoms: AICD problem   Associated symptoms: no abdominal pain, no anorexia, no back pain, no cough, no diaphoresis, no dysphagia, no fatigue, no fever, no heartburn, no lower extremity edema, no nausea, no palpitations, no PND, no shortness of breath, no syncope and no vomiting     Past Medical History:  Diagnosis Date  . COVID-19   . Diabetes mellitus without complication (HCC)   . Pre-hypertension   . Reflux     Patient Active Problem List   Diagnosis Date Noted  . Diabetes mellitus without complication (HCC) 01/21/2020  . BACK PAIN, LUMBAR 05/29/2010  . Essential hypertension 11/20/2009  . GERD 11/20/2009  . CHEST PAIN UNSPECIFIED 11/20/2009    Past Surgical History:  Procedure Laterality Date  . NO PAST SURGERIES         Home Medications    Prior to Admission medications   Medication Sig Start Date End Date Taking? Authorizing Provider  atorvastatin (LIPITOR) 20 MG tablet Take 1 tablet (20 mg total) by mouth  daily. 01/21/20   Christen Butter, NP  esomeprazole (NEXIUM) 20 MG capsule Take 20 mg by mouth daily as needed.    [provider]  lisinopril-hydrochlorothiazide (ZESTORETIC) 20-12.5 MG tablet Take 2 tablets by mouth daily. For blood pressure 04/28/20 07/27/20  Christen Butter, NP    Family History Family History  Problem Relation Age of Onset  . Hypertension Mother   . Hypertension Father   . Parkinsonism Father   . Cancer Neg Hx   . COPD Neg Hx   . Depression Neg Hx   . Hyperlipidemia Neg Hx   . Kidney disease Neg Hx   . Stroke Neg Hx     Social History Social History   Tobacco Use  . Smoking status: Never Smoker  . Smokeless tobacco: Never Used  Substance Use Topics  . Alcohol use: Yes    Comment: Rarely  . Drug use: No     Allergies   Latex   Review of Systems Review of Systems  Constitutional: Negative for diaphoresis, fatigue and fever.  HENT: Negative for trouble swallowing.   Respiratory: Positive for chest tightness. Negative for cough, choking, shortness of breath, wheezing and stridor.   Cardiovascular: Positive for chest pain. Negative for palpitations, leg swelling, syncope and PND.  Gastrointestinal: Negative for abdominal pain, anorexia, heartburn, nausea and vomiting.  Musculoskeletal: Negative for back pain.  All other systems reviewed and are negative.  Physical Exam Triage Vital Signs ED Triage Vitals  Enc Vitals Group     BP 05/15/20 1217 130/85     Pulse Rate 05/15/20 1217 87     Resp --      Temp 05/15/20 1217 98.1 F (36.7 C)     Temp Source 05/15/20 1217 Oral     SpO2 05/15/20 1217 100 %     Weight 05/15/20 1218 176 lb (79.8 kg)     Height 05/15/20 1218 5\' 7"  (1.702 m)     Head Circumference --      Peak Flow --      Pain Score 05/15/20 1218 5     Pain Loc --      Pain Edu? --      Excl. in [place]? --    No data found.  Updated Vital Signs BP 130/85 (BP Location: Right Arm)   Pulse 87   Temp 98.1 F (36.7 C) (Oral)   Ht  5\' 7"  (1.702 m)   Wt 79.8 kg   SpO2 100%   BMI 27.57 kg/m   Visual Acuity Right Eye Distance:   Left Eye Distance:   Bilateral Distance:    Right Eye Near:   Left Eye Near:    Bilateral Near:     Physical Exam Vitals and nursing note reviewed.  Constitutional:      General: He is not in acute distress. HENT:     Head: Normocephalic.     Right Ear: External ear normal.     Left Ear: External ear normal.     Nose: Nose normal.     Mouth/Throat:     Pharynx: Oropharynx is clear.  Eyes:     Pupils: Pupils are equal, round, and reactive to light.  Cardiovascular:     Rate and Rhythm: Normal rate and regular rhythm.     Heart sounds: Normal heart sounds.  Pulmonary:     Breath sounds: Normal breath sounds.  Chest:       Comments: Chest:  Distinct tenderness to palpation over the upper sternum as noted on diagram.  Abdominal:     General: Abdomen is flat.     Palpations: Abdomen is soft.     Tenderness: There is no abdominal tenderness.  Musculoskeletal:     Right lower leg: No edema.     Left lower leg: No edema.  Lymphadenopathy:     Cervical: No cervical adenopathy.  Skin:    General: Skin is warm and dry.     Findings: No erythema or rash.  Neurological:     Mental Status: He is alert.      UC Treatments / Results  Labs (all labs ordered are listed, but only abnormal results are displayed) Labs Reviewed - No data to display  EKG  Rate:  81 BPM PR:  134 msec QT:  372 msec QTcH:  432 msec QRSD:  104 msec QRS axis:  50 degrees Interpretation:   No acute changes; normal sinus rhythm   Radiology DG Chest 2 View  Result Date: 05/15/2020 CLINICAL DATA:  Left upper chest pain for 1 week. EXAM: CHEST - 2 VIEW COMPARISON:  PA and lateral chest 06/03/2019. FINDINGS: Lungs clear. Heart size normal. No pneumothorax or pleural fluid. No acute or focal bony abnormality. IMPRESSION: Negative chest. Electronically Signed   By: 05/17/2020 M.D.   On:  05/15/2020 14:36    Procedures Procedures (including critical care time)  Medications Ordered in UC Medications - No  data to display  Initial Impression / Assessment and Plan / UC Course  I have reviewed the triage vital signs and the nursing notes.  Pertinent labs & imaging results that were available during my care of the patient were reviewed by me and considered in my medical decision making (see chart for details).    Negative EKG and chest X-ray reassuring. Followup with Family Doctor if symptoms persist.  . Final Clinical Impressions(s) / UC Diagnoses   Final diagnoses:  Chest pain on breathing  Costochondritis     Discharge Instructions     Apply ice pack for 20 to 30 minutes, 3 to 4 times daily  Continue until pain decreases.  May take Ibuprofen 200mg , 4 tabs every 8 hours with food.     ED Prescriptions    None        Kandra Nicolas, MD 05/18/20 [phone]

## 2020-06-30 ENCOUNTER — Encounter: Payer: Self-pay | Admitting: Nurse Practitioner

## 2020-07-28 ENCOUNTER — Ambulatory Visit: Payer: 59 | Admitting: Medical-Surgical

## 2020-09-07 ENCOUNTER — Telehealth: Payer: Self-pay

## 2020-09-07 ENCOUNTER — Emergency Department
Admission: EM | Admit: 2020-09-07 | Discharge: 2020-09-07 | Disposition: A | Discharge status: Home / Self Care | Payer: 59 | Source: Home / Self Care

## 2020-09-07 ENCOUNTER — Other Ambulatory Visit: Payer: Self-pay

## 2020-09-07 DIAGNOSIS — R031 Nonspecific low blood-pressure reading: Secondary | ICD-10-CM

## 2020-09-07 DIAGNOSIS — R42 Dizziness and giddiness: Secondary | ICD-10-CM

## 2020-09-07 LAB — POCT FASTING CBG KUC MANUAL ENTRY: POCT Glucose (KUC): 158 mg/dL — AB (ref 70–99)

## 2020-09-07 NOTE — Telephone Encounter (Signed)
Pt states he has been having lightheadedness x 2 weeks and his BP has been running in the high 90's/low 60's. He states that the lightheadedness has been extreme lately and has felt like he is going to pass out. He is currently taking 2 different BP meds. He stated that after exercising earlier today that the sx were pretty severe and his BP was 94/58, and yesterday it was 96/59. He LVM with this information and when I returned his call he was being seen through [facility]. I called him and asked he return my call and when he called back he said that he was told by the provider in UC that they would talk to Joy real quick and see what she would like for the pt to be instructed to do. He states the UC provider told him to decrease his BP meds down to 1 pill and to keep the f/up appt already scheduled with Joy on 10/06/2020.

## 2020-09-07 NOTE — ED Provider Notes (Signed)
[name]    CSN: [id number] Arrival date & time: 09/07/20  1216      History   Chief Complaint Chief Complaint  Patient presents with  . Dizziness    HPI Robert Glover is a 44 y.o. male.   HPI  Robert Glover is a 44 y.o. male presenting to UC with c/o dizziness that started earlier today while working out at the gym.  He took his BP at home as the dizziness continued, BP was 94/58.  He laid down in bed and has felt at little better but is concerned about the dizziness persisting even after returning home from the gym.  He was started on BP medication, lisinorpil-HCTZ 20-12.5mg  BID in April 2021.  Since then, he has been going to the gym, increased his water intake and has dropped several clothes sizes, intentionally.  Denies HA or dizziness at this time. Denies chest pain or SOB.  He is not sure if his medication needs adjustment.   Past Medical History:  Diagnosis Date  . COVID-19   . Diabetes mellitus without complication (HCC)   . Pre-hypertension   . Reflux     Patient Active Problem List   Diagnosis Date Noted  . Diabetes mellitus without complication (HCC) 01/21/2020  . BACK PAIN, LUMBAR 05/29/2010  . Essential hypertension 11/20/2009  . GERD 11/20/2009  . CHEST PAIN UNSPECIFIED 11/20/2009    Past Surgical History:  Procedure Laterality Date  . NO PAST SURGERIES         Home Medications    Prior to Admission medications   Medication Sig Start Date End Date Taking? Authorizing Provider  atorvastatin (LIPITOR) 20 MG tablet Take 1 tablet (20 mg total) by mouth daily. 01/21/20  Yes Christen Butter, NP  lisinopril-hydrochlorothiazide (ZESTORETIC) 20-12.5 MG tablet Take 2 tablets by mouth daily. For blood pressure 04/28/20 09/07/20 Yes Jessup, Joy, NP  esomeprazole (NEXIUM) 20 MG capsule Take 20 mg by mouth daily as needed.    [provider]    Family History Family History  Problem Relation Age of Onset  . Hypertension  Mother   . Hypertension Father   . Parkinsonism Father   . Cancer Neg Hx   . COPD Neg Hx   . Depression Neg Hx   . Hyperlipidemia Neg Hx   . Kidney disease Neg Hx   . Stroke Neg Hx     Social History Social History   Tobacco Use  . Smoking status: Never Smoker  . Smokeless tobacco: Never Used  Vaping Use  . Vaping Use: Never used  Substance Use Topics  . Alcohol use: Yes    Comment: Rarely  . Drug use: No     Allergies   Latex   Review of Systems Review of Systems  Cardiovascular: Negative for chest pain and palpitations.  Gastrointestinal: Negative for diarrhea, nausea and vomiting.  Neurological: Positive for dizziness and weakness ( generalized ). Negative for light-headedness and headaches.     Physical Exam Triage Vital Signs ED Triage Vitals  Enc Vitals Group     BP 09/07/20 1237 128/84     Pulse Rate 09/07/20 1237 97     Resp 09/07/20 1237 16     Temp 09/07/20 1233 98.3 F (36.8 C)     Temp Source 09/07/20 1233 Oral     SpO2 09/07/20 1237 97 %     Weight --      Height --      Head Circumference --  Peak Flow --      Pain Score 09/07/20 1230 0     Pain Loc --      Pain Edu? --      Excl. in GC? --    No data found.  Updated Vital Signs BP 128/84 (BP Location: Right Arm)   Pulse 97   Temp 98.3 F (36.8 C) (Oral)   Resp 16   SpO2 97%   Visual Acuity Right Eye Distance:   Left Eye Distance:   Bilateral Distance:    Right Eye Near:   Left Eye Near:    Bilateral Near:     Physical Exam Vitals and nursing note reviewed.  Constitutional:      General: He is not in acute distress.    Appearance: Normal appearance. He is well-developed. He is not ill-appearing, toxic-appearing or diaphoretic.  HENT:     Head: Normocephalic and atraumatic.     Right Ear: Tympanic membrane and ear canal normal.     Left Ear: Tympanic membrane and ear canal normal.     Nose: Nose normal.     Mouth/Throat:     Mouth: Mucous membranes are moist.    Cardiovascular:     Rate and Rhythm: Normal rate and regular rhythm.  Pulmonary:     Effort: Pulmonary effort is normal. No respiratory distress.     Breath sounds: Normal breath sounds.  Musculoskeletal:        General: Normal range of motion.     Cervical back: Normal range of motion.  Skin:    General: Skin is warm and dry.     Capillary Refill: Capillary refill takes less than 2 seconds.  Neurological:     General: No focal deficit present.     Mental Status: He is alert and oriented to person, place, and time.  Psychiatric:        Mood and Affect: Mood normal.        Behavior: Behavior normal.      UC Treatments / Results  Labs (all labs ordered are listed, but only abnormal results are displayed) Labs Reviewed  POCT FASTING [facility] MANUAL ENTRY - Abnormal; Notable for the following components:      Result Value   POCT Glucose ([facility]) 158 (*)    All other components within normal limits    EKG   Radiology No results found.  Procedures Procedures (including critical care time)  Medications Ordered in UC Medications - No data to display  Initial Impression / Assessment and Plan / UC Course  I have reviewed the triage vital signs and the nursing notes.  Pertinent labs & imaging results that were available during my care of the patient were reviewed by me and considered in my medical decision making (see chart for details).     Reassured pt normal vitals in UC Episode of hypotension possible mild dehydration Advised pt he is doing great at lifestyle modification Can try trial of taking half of current BP medication F/u with PCP next week  Discussed symptoms that warrant emergent care in the ED. AVS given  Final Clinical Impressions(s) / UC Diagnoses   Final diagnoses:  Dizziness  Low blood pressure reading     Discharge Instructions      You have done a fantastic job of lifestyle modification to help lower your blood pressure. Please continue to  monitor your blood pressure while you lower your current medication from 2 pills daily to 1 pill daily.  You  may continue to go to the gym but stop exercising and check your blood pressure if you develop weakness/dizziness again.  Follow up with a medical provider if you develop chest pain or trouble breathing.  Keep track of your blood pressure until your October 8th appointment and let her know you are cutting current dose in half.  She may decide to prescribe a single medication or try a trial of no blood pressure medication depending on your home readings and symptoms between now and then.   Call 911 or have someone drive you to the hospital if symptoms significantly worsening.     ED Prescriptions    None     PDMP not reviewed this encounter.   Lurene Shadow, [place] 09/08/20 2044

## 2020-09-07 NOTE — ED Triage Notes (Signed)
Patient presents to Urgent Care with complaints of dizziness since earlier today while working out at the gym. Patient reports he went home, dizziness continued and he became nauseous. Pt took his BP (94/58), then laid down in the bed for a while before coming in for evaluation. Pt states yesterday he had a similar feeling and his BP was 97/59. Pt's PCP recently put him on some BP medications, pt reports making significant lifestyle changes including diet changes and exercise.  Pt denies pain, still feels a little nausea and the lightheadedness has gotten better since earlier today. Pt states he has had one 16-oz bottle of water per day, usually drinks 3 per day.

## 2020-09-07 NOTE — Discharge Instructions (Signed)
  You have done a fantastic job of lifestyle modification to help lower your blood pressure. Please continue to monitor your blood pressure while you lower your current medication from 2 pills daily to 1 pill daily.  You may continue to go to the gym but stop exercising and check your blood pressure if you develop weakness/dizziness again.  Follow up with a medical provider if you develop chest pain or trouble breathing.  Keep track of your blood pressure until your October 8th appointment and let her know you are cutting current dose in half.  She may decide to prescribe a single medication or try a trial of no blood pressure medication depending on your home readings and symptoms between now and then.   Call 911 or have someone drive you to the hospital if symptoms significantly worsening.

## 2020-10-01 NOTE — Progress Notes (Signed)
Subjective:    CC: HTN and DM follow up  HPI: Pleasant 44 year old male presenting for follow up of HTN and DM.  HTN- was seen on 09/07/20 at [facility] for low BP and suspected dehydration. Was advised by UC to take half of his blood pressure medications. Since then he has been taking 1 tablet of Lisinopril-HCTZ 20-12.5mg  daily. Has made quite a few lifestyle changes to lower his blood pressure and has stuck with them. Has been walking 4 miles daily and going to the gym for light weight training. Notes in September, he was having dizzy spells when going from sitting to standing. No more episodes of dizziness and wooziness since going to 1 tablet. Diet and exercise during August did suffer because his father passed away and there was an increase in stress.  Costochondritis- having the occasional flares of midchest discomfort. Taking Ibuprofen 1000mg  x one with the onset of his discomfort which helps. Also using cold compresses that seem to help.   DM- diet controlled. Checking random sugars every other day, no spikes over 200. No hypoglycemia episodes.   I reviewed the past medical history, family history, social history, surgical history, and allergies today and no changes were needed.  Please see the problem list section below in epic for further details.  Past Medical History: Past Medical History:  Diagnosis Date  . COVID-19   . Diabetes mellitus without complication (HCC)   . Pre-hypertension   . Reflux    Past Surgical History: Past Surgical History:  Procedure Laterality Date  . NO PAST SURGERIES     Social History: Social History   Socioeconomic History  . Marital status: Single    Spouse name: Not on file  . Number of children: Not on file  . Years of education: Not on file  . Highest education level: Not on file  Occupational History  . Occupation:  Tobacco Use  . Smoking status: Never Smoker  . Smokeless tobacco: Never Used  Vaping Use  . Vaping Use:  Never used  Substance and Sexual Activity  . Alcohol use: Yes    Comment: Rarely  . Drug use: No  . Sexual activity: Never  Other Topics Concern  . Not on file  Social History Narrative  . Not on file   Social Determinants of Health   Financial Resource Strain:   . Difficulty of Paying Living Expenses: Not on file  Food Insecurity:   . Worried About Automotive engineer in the Last Year: Not on file  . Ran Out of Food in the Last Year: Not on file  Transportation Needs:   . Lack of Transportation (Medical): Not on file  . Lack of Transportation (Non-Medical): Not on file  Physical Activity:   . Days of Exercise per Week: Not on file  . Minutes of Exercise per Session: Not on file  Stress:   . Feeling of Stress : Not on file  Social Connections:   . Frequency of Communication with Friends and Family: Not on file  . Frequency of Social Gatherings with Friends and Family: Not on file  . Attends Religious Services: Not on file  . Active Member of Clubs or Organizations: Not on file  . Attends Programme researcher, broadcasting/film/video Meetings: Not on file  . Marital Status: Not on file   Family History: Family History  Problem Relation Age of Onset  . Hypertension Mother   . Hypertension Father   . Parkinsonism Father   . Cancer  Neg Hx   . COPD Neg Hx   . Depression Neg Hx   . Hyperlipidemia Neg Hx   . Kidney disease Neg Hx   . Stroke Neg Hx    Allergies: Allergies  Allergen Reactions  . Latex Rash   Medications: See med rec.  Review of Systems: See HPI for pertinent positives and negatives.   Objective:    General: Well Developed, well nourished, and in no acute distress.  Neuro: Alert and oriented x3, extra-ocular muscles intact, sensation grossly intact.  HEENT: Normocephalic, atraumatic, pupils equal round reactive to light, neck supple, no masses, no lymphadenopathy, thyroid nonpalpable.  Skin: Warm and dry, no rashes. Cardiac: Regular rate and rhythm, no murmurs rubs or  gallops, no lower extremity edema.  Respiratory: Clear to auscultation bilaterally. Not using accessory muscles, speaking in full sentences.   Impression and Recommendations:    1. Essential hypertension Home BP at goal although elevated in office today. Checking CBC, CMP. Continue Lisinopril-HCTZ 20-12.5mg  1 tablet daily. Continue to monitor BP at home with goal of less than 130/80. - CBC - COMPLETE METABOLIC PANEL WITH GFR - lisinopril-hydrochlorothiazide (ZESTORETIC) 20-12.5 MG tablet; Take 1 tablet by mouth daily. For blood pressure  Dispense: 90 tablet; Refill: 0  2. Diabetes mellitus without complication (HCC) Checking HgbA1c.  - Hemoglobin A1c  3. Need for hepatitis C screening test Discussed screening recommendations. Patient agreeable so adding to blood work today.  - Hepatitis C Antibody  Return in about 6 months (around 04/02/2021) for DM/HTN follow up. ___________________________________________ Thayer Ohm, DNP, APRN, FNP-BC Primary Care and Sports Medicine [facility] [place]

## 2020-10-02 ENCOUNTER — Encounter: Payer: Self-pay | Admitting: Medical-Surgical

## 2020-10-02 ENCOUNTER — Ambulatory Visit (INDEPENDENT_AMBULATORY_CARE_PROVIDER_SITE_OTHER): Payer: 59 | Admitting: Medical-Surgical

## 2020-10-02 ENCOUNTER — Other Ambulatory Visit: Payer: Self-pay

## 2020-10-02 VITALS — BP 144/88 | HR 84 | Temp 98.2°F | Ht 66.75 in | Wt 179.7 lb

## 2020-10-02 DIAGNOSIS — Z1159 Encounter for screening for other viral diseases: Secondary | ICD-10-CM | POA: Diagnosis not present

## 2020-10-02 DIAGNOSIS — E119 Type 2 diabetes mellitus without complications: Secondary | ICD-10-CM | POA: Diagnosis not present

## 2020-10-02 DIAGNOSIS — I10 Essential (primary) hypertension: Secondary | ICD-10-CM

## 2020-10-02 MED ORDER — ATORVASTATIN CALCIUM 20 MG PO TABS: 20.0000 mg | ORAL_TABLET | Freq: Every day | ORAL | 1 refills | Status: DC

## 2020-10-02 MED ORDER — LISINOPRIL-HYDROCHLOROTHIAZIDE 20-12.5 MG PO TABS: 1.0000 | ORAL_TABLET | Freq: Every day | ORAL | 0 refills | Status: DC

## 2020-10-02 NOTE — Addendum Note (Signed)
Addended by: Chalmers Cater on: 10/02/2020 02:46 PM   Modules accepted: Orders

## 2020-10-03 LAB — COMPLETE METABOLIC PANEL WITH GFR
AG Ratio: 2.1 (calc) (ref 1.0–2.5)
ALT: 31 U/L (ref 9–46)
AST: 19 U/L (ref 10–40)
Albumin: 4.8 g/dL (ref 3.6–5.1)
Alkaline phosphatase (APISO): 47 U/L (ref 36–130)
BUN: 17 mg/dL (ref 7–25)
CO2: 32 mmol/L (ref 20–32)
Calcium: 10.1 mg/dL (ref 8.6–10.3)
Chloride: 102 mmol/L (ref 98–110)
Creat: 1.17 mg/dL (ref 0.60–1.35)
GFR, Est African American: 88 mL/min/{1.73_m2} (ref >=60)
GFR, Est Non African American: 76 mL/min/{1.73_m2} (ref >=60)
Globulin: 2.3 g/dL (calc) (ref 1.9–3.7)
Glucose, Bld: 93 mg/dL (ref 65–139)
Potassium: 5 mmol/L (ref 3.5–5.3)
Sodium: 141 mmol/L (ref 135–146)
Total Bilirubin: 0.8 mg/dL (ref 0.2–1.2)
Total Protein: 7.1 g/dL (ref 6.1–8.1)

## 2020-10-03 LAB — HEPATITIS C ANTIBODY
Hepatitis C Ab: NONREACTIVE
SIGNAL TO CUT-OFF: 0.01 (ref <1.00)

## 2020-10-03 LAB — CBC
HCT: 44.7 % (ref 38.5–50.0)
Hemoglobin: 15.5 g/dL (ref 13.2–17.1)
MCH: 30.8 pg (ref 27.0–33.0)
MCHC: 34.7 g/dL (ref 32.0–36.0)
MCV: 88.9 fL (ref 80.0–100.0)
MPV: 10.8 fL (ref 7.5–12.5)
Platelets: 245 10*3/uL (ref 140–400)
RBC: 5.03 10*6/uL (ref 4.20–5.80)
RDW: 13.6 % (ref 11.0–15.0)
WBC: 5.4 10*3/uL (ref 3.8–10.8)

## 2020-10-03 LAB — HEMOGLOBIN A1C
Hgb A1c MFr Bld: 5.8 % of total Hgb — ABNORMAL HIGH (ref <5.7)
Mean Plasma Glucose: 120 (calc)
eAG (mmol/L): 6.6 (calc)

## 2020-10-06 ENCOUNTER — Ambulatory Visit: Payer: 59 | Admitting: Medical-Surgical

## 2020-10-10 ENCOUNTER — Encounter: Payer: Self-pay | Admitting: Medical-Surgical

## 2020-11-01 ENCOUNTER — Other Ambulatory Visit: Payer: Self-pay | Admitting: Medical-Surgical

## 2020-11-01 DIAGNOSIS — I10 Essential (primary) hypertension: Secondary | ICD-10-CM

## 2020-12-28 ENCOUNTER — Other Ambulatory Visit: Payer: Self-pay | Admitting: Medical-Surgical

## 2020-12-28 ENCOUNTER — Encounter: Payer: Self-pay | Admitting: Medical-Surgical

## 2020-12-28 DIAGNOSIS — I10 Essential (primary) hypertension: Secondary | ICD-10-CM

## 2020-12-28 MED ORDER — LISINOPRIL-HYDROCHLOROTHIAZIDE 20-12.5 MG PO TABS: 1.0000 | ORAL_TABLET | Freq: Every day | ORAL | 0 refills | Status: DC

## 2020-12-29 ENCOUNTER — Other Ambulatory Visit: Payer: Self-pay | Admitting: Nurse Practitioner

## 2020-12-29 DIAGNOSIS — I10 Essential (primary) hypertension: Secondary | ICD-10-CM

## 2021-03-04 ENCOUNTER — Encounter: Payer: Self-pay | Admitting: Emergency Medicine

## 2021-03-04 ENCOUNTER — Emergency Department
Admission: EM | Admit: 2021-03-04 | Discharge: 2021-03-04 | Disposition: A | Discharge status: Home / Self Care | Payer: 59 | Source: Home / Self Care

## 2021-03-04 ENCOUNTER — Other Ambulatory Visit: Payer: Self-pay

## 2021-03-04 DIAGNOSIS — K2100 Gastro-esophageal reflux disease with esophagitis, without bleeding: Secondary | ICD-10-CM

## 2021-03-04 DIAGNOSIS — R0789 Other chest pain: Secondary | ICD-10-CM

## 2021-03-04 MED ORDER — LIDOCAINE VISCOUS HCL 2 % MT SOLN
15.0000 mL | Freq: Once | OROMUCOSAL | Status: AC
  Administered 2021-03-04: 15 mL via ORAL

## 2021-03-04 MED ORDER — ALUM & MAG HYDROXIDE-SIMETH 200-200-20 MG/5ML PO SUSP
30.0000 mL | Freq: Once | ORAL | Status: AC
  Administered 2021-03-04: 30 mL via ORAL

## 2021-03-04 NOTE — Discharge Instructions (Signed)
EKG is normal today  We gave you a GI cocktail in the office today  I would dose your nexium twice a day for 14 days, then once a day for 14 days  Follow up with GI as scheduled

## 2021-03-04 NOTE — ED Triage Notes (Signed)
Sternal pressure - min relief w/ nexium - started yesterday  Hx of costochondritis- does not feel like that  Less dysphagia the last 2-3 weeks

## 2021-03-08 NOTE — ED Provider Notes (Signed)
[name]    CSN: [id number] Arrival date & time: 03/04/21  1340      History   Chief Complaint Chief Complaint  Patient presents with  . Chest Pain    HPI Robert Glover is a 45 y.o. male.   Reports chest discomfort intermittently over the last day. Reports that he has a history of GERD. Reports that he took Nexium with little relief. Reports that he has history of costochondritis as well and that this feels different. Denies cardiac history. Denies fatigue, sweating, chest pain/pressure, radiating pain, arm pain, jaw pain, palpitations, SOB.   ROS per HPI         The history is provided by the patient.  Chest Pain   Past Medical History:  Diagnosis Date  . COVID-19   . Diabetes mellitus without complication (HCC)   . Pre-hypertension   . Reflux     Patient Active Problem List   Diagnosis Date Noted  . Diabetes mellitus without complication (HCC) 01/21/2020  . BACK PAIN, LUMBAR 05/29/2010  . Essential hypertension 11/20/2009  . GERD 11/20/2009  . CHEST PAIN UNSPECIFIED 11/20/2009    Past Surgical History:  Procedure Laterality Date  . NO PAST SURGERIES         Home Medications    Prior to Admission medications   Medication Sig Start Date End Date Taking? Authorizing Provider  atorvastatin (LIPITOR) 20 MG tablet Take 1 tablet (20 mg total) by mouth daily. 10/02/20  Yes Christen Butter, NP  esomeprazole (NEXIUM) 20 MG capsule Take 20 mg by mouth daily as needed.   Yes [provider]  lisinopril-hydrochlorothiazide (ZESTORETIC) 20-12.5 MG tablet TAKE 2 TABLETS BY MOUTH DAILY FOR BLOOD PRESSURE 01/01/21  Yes Early, Sung Amabile, NP    Family History Family History  Problem Relation Age of Onset  . Hypertension Mother   . Hypertension Father   . Parkinsonism Father   . Cancer Neg Hx   . COPD Neg Hx   . Depression Neg Hx   . Hyperlipidemia Neg Hx   . Kidney disease Neg Hx   . Stroke Neg Hx     Social History Social History    Tobacco Use  . Smoking status: Never Smoker  . Smokeless tobacco: Never Used  Vaping Use  . Vaping Use: Never used  Substance Use Topics  . Alcohol use: Yes    Comment: Rarely  . Drug use: No     Allergies   Latex   Review of Systems Review of Systems  Cardiovascular: Positive for chest pain.     Physical Exam Triage Vital Signs ED Triage Vitals  Enc Vitals Group     BP 03/04/21 1349 139/83     Pulse Rate 03/04/21 1349 (!) 101     Resp 03/04/21 1349 16     Temp 03/04/21 1349 98.8 F (37.1 C)     Temp Source 03/04/21 1349 Oral     SpO2 03/04/21 1349 99 %     Weight --      Height --      Head Circumference --      Peak Flow --      Pain Score 03/04/21 1352 2     Pain Loc --      Pain Edu? --      Excl. in GC? --    No data found.  Updated Vital Signs BP 139/83 (BP Location: Left Arm)   Pulse (!) 101   Temp 98.8 F (  37.1 C) (Oral)   Resp 16   SpO2 99%   Visual Acuity Right Eye Distance:   Left Eye Distance:   Bilateral Distance:    Right Eye Near:   Left Eye Near:    Bilateral Near:     Physical Exam Vitals and nursing note reviewed.  Constitutional:      General: He is not in acute distress.    Appearance: He is well-developed. He is not ill-appearing.  HENT:     Head: Normocephalic and atraumatic.  Eyes:     Conjunctiva/sclera: Conjunctivae normal.  Cardiovascular:     Rate and Rhythm: Normal rate and regular rhythm.  No extrasystoles are present.    Chest Wall: PMI is not displaced.     Pulses:          Carotid pulses are 2+ on the right side and 2+ on the left side.      Radial pulses are 2+ on the right side and 2+ on the left side.       Dorsalis pedis pulses are 2+ on the right side and 2+ on the left side.     Heart sounds: Normal heart sounds. No murmur heard.   Pulmonary:     Effort: Pulmonary effort is normal. No respiratory distress.     Breath sounds: Normal breath sounds. No decreased breath sounds, wheezing, rhonchi or  rales.  Abdominal:     Palpations: Abdomen is soft.     Tenderness: There is no abdominal tenderness.  Musculoskeletal:        General: Normal range of motion.     Cervical back: Normal range of motion and neck supple.  Skin:    General: Skin is warm and dry.     Capillary Refill: Capillary refill takes less than 2 seconds.  Neurological:     General: No focal deficit present.     Mental Status: He is alert.  Psychiatric:        Mood and Affect: Mood normal.        Behavior: Behavior normal.      UC Treatments / Results  Labs (all labs ordered are listed, but only abnormal results are displayed) Labs Reviewed - No data to display  EKG   Radiology No results found.  Procedures Procedures (including critical care time)  Medications Ordered in UC Medications  alum & mag hydroxide-simeth (MAALOX/MYLANTA) 200-200-20 MG/5ML suspension 30 mL (30 mLs Oral Given 03/04/21 1443)    And  lidocaine (XYLOCAINE) 2 % viscous mouth solution 15 mL (15 mLs Oral Given 03/04/21 1443)    Initial Impression / Assessment and Plan / UC Course  I have reviewed the triage vital signs and the nursing notes.  Pertinent labs & imaging results that were available during my care of the patient were reviewed by me and considered in my medical decision making (see chart for details).    Chest Discomfort GERD  EKG shows NSR in office GI cocktail in office today Symptoms relieved with this treatment Discussed heart attack symptoms and when to seek higher level of care Take omeprazole BID x 2 weeks, then once daily x 2 weeks Follow up with GI as scheduled   Final Clinical Impressions(s) / UC Diagnoses   Final diagnoses:  Chest discomfort  Gastroesophageal reflux disease with esophagitis without hemorrhage     Discharge Instructions     EKG is normal today  We gave you a GI cocktail in the office today  I would dose your  nexium twice a day for 14 days, then once a day for 14  days  Follow up with GI as scheduled    ED Prescriptions    None     PDMP not reviewed this encounter.   Moshe Cipro, NP 03/08/21 1520

## 2021-03-09 ENCOUNTER — Emergency Department (HOSPITAL_BASED_OUTPATIENT_CLINIC_OR_DEPARTMENT_OTHER): Payer: 59

## 2021-03-09 ENCOUNTER — Other Ambulatory Visit: Payer: Self-pay

## 2021-03-09 ENCOUNTER — Emergency Department (HOSPITAL_BASED_OUTPATIENT_CLINIC_OR_DEPARTMENT_OTHER)
Admission: EM | Admit: 2021-03-09 | Discharge: 2021-03-09 | Disposition: A | Discharge status: Home / Self Care | Payer: 59 | Attending: Emergency Medicine | Admitting: Emergency Medicine

## 2021-03-09 ENCOUNTER — Encounter (HOSPITAL_BASED_OUTPATIENT_CLINIC_OR_DEPARTMENT_OTHER): Payer: Self-pay

## 2021-03-09 DIAGNOSIS — I10 Essential (primary) hypertension: Secondary | ICD-10-CM | POA: Insufficient documentation

## 2021-03-09 DIAGNOSIS — Z8616 Personal history of COVID-19: Secondary | ICD-10-CM | POA: Diagnosis not present

## 2021-03-09 DIAGNOSIS — R1310 Dysphagia, unspecified: Secondary | ICD-10-CM | POA: Diagnosis not present

## 2021-03-09 DIAGNOSIS — E119 Type 2 diabetes mellitus without complications: Secondary | ICD-10-CM | POA: Insufficient documentation

## 2021-03-09 DIAGNOSIS — Z91040 Latex allergy status: Secondary | ICD-10-CM | POA: Insufficient documentation

## 2021-03-09 DIAGNOSIS — R0789 Other chest pain: Secondary | ICD-10-CM | POA: Insufficient documentation

## 2021-03-09 DIAGNOSIS — Z79899 Other long term (current) drug therapy: Secondary | ICD-10-CM | POA: Insufficient documentation

## 2021-03-09 LAB — CBC WITH DIFFERENTIAL/PLATELET
Abs Immature Granulocytes: 0.02 10*3/uL (ref 0.00–0.07)
Basophils Absolute: 0.1 10*3/uL (ref 0.0–0.1)
Basophils Relative: 1 %
Eosinophils Absolute: 0.6 10*3/uL — ABNORMAL HIGH (ref 0.0–0.5)
Eosinophils Relative: 8 %
HCT: 45.7 % (ref 39.0–52.0)
Hemoglobin: 16.3 g/dL (ref 13.0–17.0)
Immature Granulocytes: 0 %
Lymphocytes Relative: 23 %
Lymphs Abs: 1.9 10*3/uL (ref 0.7–4.0)
MCH: 30.2 pg (ref 26.0–34.0)
MCHC: 35.7 g/dL (ref 30.0–36.0)
MCV: 84.6 fL (ref 80.0–100.0)
Monocytes Absolute: 0.6 10*3/uL (ref 0.1–1.0)
Monocytes Relative: 7 %
Neutro Abs: 5 10*3/uL (ref 1.7–7.7)
Neutrophils Relative %: 61 %
Platelets: 259 10*3/uL (ref 150–400)
RBC: 5.4 MIL/uL (ref 4.22–5.81)
RDW: 12.9 % (ref 11.5–15.5)
WBC: 8.3 10*3/uL (ref 4.0–10.5)
nRBC: 0 % (ref 0.0–0.2)

## 2021-03-09 LAB — BASIC METABOLIC PANEL
Anion gap: 11 (ref 5–15)
BUN: 17 mg/dL (ref 6–20)
CO2: 27 mmol/L (ref 22–32)
Calcium: 9.4 mg/dL (ref 8.9–10.3)
Chloride: 100 mmol/L (ref 98–111)
Creatinine, Ser: 1.37 mg/dL — ABNORMAL HIGH (ref 0.61–1.24)
GFR, Estimated: >60 mL/min (ref >60)
Glucose, Bld: 136 mg/dL — ABNORMAL HIGH (ref 70–99)
Potassium: 3.7 mmol/L (ref 3.5–5.1)
Sodium: 138 mmol/L (ref 135–145)

## 2021-03-09 LAB — D-DIMER, QUANTITATIVE: D-Dimer, Quant: 0.37 ug/mL-FEU (ref 0.00–0.50)

## 2021-03-09 LAB — TROPONIN I (HIGH SENSITIVITY): Troponin I (High Sensitivity): 3 ng/L (ref <18)

## 2021-03-09 NOTE — ED Provider Notes (Signed)
[facility] EMERGENCY DEPARTMENT Provider Note  CSN: [id number] Arrival date & time: 03/09/21 1539    History Chief Complaint  Patient presents with  . Chest Pain    HPI  Robert Glover is a 45 y.o. male with history of HTN and HLD reports 9 days of persistent, but waxing and waning chest discomfort, not described as a pain but more likely a pressure/squeezing. Mild-moderate and not associated with SOB, nausea or diaphoresis. Prior to pain starting he was also having some mild dysphagia/globus sensation. He went to a local UC 5 days ago where EKG was done and he was given a GI cocktail which he states numbed his esophagus but discomfort returned when it wore off. He was prescribed PPI and advised to follow up with GI. He reports the dysphagia/globus sensation has resolved, but the chest discomfort continued. Today after walking around his workplace his chest discomfort seemed to spread out and so he came to the ED for evaluation. He has had costochondritis in the past, but this feels different.     Past Medical History:  Diagnosis Date  . COVID-19   . Diabetes mellitus without complication (HCC)   . Pre-hypertension   . Reflux     Past Surgical History:  Procedure Laterality Date  . NO PAST SURGERIES      Family History  Problem Relation Age of Onset  . Hypertension Mother   . Hypertension Father   . Parkinsonism Father   . Cancer Neg Hx   . COPD Neg Hx   . Depression Neg Hx   . Hyperlipidemia Neg Hx   . Kidney disease Neg Hx   . Stroke Neg Hx     Social History   Tobacco Use  . Smoking status: Never Smoker  . Smokeless tobacco: Never Used  Vaping Use  . Vaping Use: Never used  Substance Use Topics  . Alcohol use: Yes    Comment: Rarely  . Drug use: No     Home Medications Prior to Admission medications   Medication Sig Start Date End Date Taking? Authorizing Provider  atorvastatin (LIPITOR) 20 MG tablet Take 1 tablet (20 mg total) by  mouth daily. 10/02/20   Christen Butter, NP  esomeprazole (NEXIUM) 20 MG capsule Take 20 mg by mouth daily as needed.    [provider]  lisinopril-hydrochlorothiazide (ZESTORETIC) 20-12.5 MG tablet TAKE 2 TABLETS BY MOUTH DAILY FOR BLOOD PRESSURE 01/01/21   Early, Sung Amabile, NP     Allergies    Latex   Review of Systems   Review of Systems A comprehensive review of systems was completed and negative except as noted in HPI.    Physical Exam BP 140/87   Pulse 85   Temp 98.6 F (37 C) (Oral)   Resp 12   SpO2 100%   Physical Exam Vitals and nursing note reviewed.  Constitutional:      Appearance: Normal appearance.  HENT:     Head: Normocephalic and atraumatic.     Nose: Nose normal.     Mouth/Throat:     Mouth: Mucous membranes are moist.  Eyes:     Extraocular Movements: Extraocular movements intact.     Conjunctiva/sclera: Conjunctivae normal.  Cardiovascular:     Rate and Rhythm: Normal rate.  Pulmonary:     Effort: Pulmonary effort is normal.     Breath sounds: Normal breath sounds.  Chest:     Chest wall: No tenderness.  Abdominal:  General: Abdomen is flat.     Palpations: Abdomen is soft.     Tenderness: There is no abdominal tenderness.  Musculoskeletal:        General: No swelling. Normal range of motion.     Cervical back: Neck supple.  Skin:    General: Skin is warm and dry.  Neurological:     General: No focal deficit present.     Mental Status: He is alert.  Psychiatric:        Mood and Affect: Mood normal.      ED Results / Procedures / Treatments   Labs (all labs ordered are listed, but only abnormal results are displayed) Labs Reviewed  BASIC METABOLIC PANEL - Abnormal; Notable for the following components:      Result Value   Glucose, Bld 136 (*)    Creatinine, Ser 1.37 (*)    All other components within normal limits  CBC WITH DIFFERENTIAL/PLATELET - Abnormal; Notable for the following components:   Eosinophils Absolute 0.6 (*)     All other components within normal limits  D-DIMER, QUANTITATIVE  TROPONIN I (HIGH SENSITIVITY)  TROPONIN I (HIGH SENSITIVITY)    EKG None   Radiology DG Chest 2 View  Result Date: 03/09/2021 CLINICAL DATA:  Chest pain EXAM: CHEST - 2 VIEW COMPARISON:  May 15, 2020 FINDINGS: The heart size and mediastinal contours are within normal limits. Both lungs are clear. The visualized skeletal structures are unremarkable. IMPRESSION: No active cardiopulmonary disease. Electronically Signed   By: Katherine Mantle M.D.   On: 03/09/2021 17:22    Procedures Procedures  Medications Ordered in the ED Medications - No data to display   MDM Rules/Calculators/A&P MDM Patient with chest pain not typical for ACS/CAD but persistent for the last 9 days. Will check labs, CXR and cardiac monitor.   ED Course  I have reviewed the triage vital signs and the nursing notes.  Pertinent labs & imaging results that were available during my care of the patient were reviewed by me and considered in my medical decision making (see chart for details).  Clinical Course as of 03/09/21 1903  Fri Mar 09, 2021  1716 CBC is normal.  [CS]  1740 BMP and dimer are normal.  [CS]  1800 Trop is neg. Given duration of symptoms, additional testing is not needed. CXR is clear.  [CS]  1805 Discussed results with patient. No concerning findings in the ED. Symptoms are atypical for CAD/ACS. He has PCP follow up already scheduled in 2 days. RTED for any other concerns.  [CS]    Clinical Course User Index [CS] Pollyann Savoy, MD    Final Clinical Impression(s) / ED Diagnoses Final diagnoses:  Atypical chest pain    Rx / DC Orders ED Discharge Orders    None       Pollyann Savoy, MD 03/09/21 1904

## 2021-03-09 NOTE — Progress Notes (Addendum)
TOC CM chart reviewed. Pt had 3 Ed visits in past six months. Noted to have PCP and insurance. Pt has follow up with PCP on Monday, 03/12/2021.  Isidoro Donning RN CCM, [facility] ED TOC CM [phone]

## 2021-03-09 NOTE — ED Triage Notes (Signed)
Pt c/o intermittent CP x 9 days-NAD-steady gait

## 2021-03-12 ENCOUNTER — Encounter: Payer: Self-pay | Admitting: Medical-Surgical

## 2021-03-12 ENCOUNTER — Other Ambulatory Visit: Payer: Self-pay

## 2021-03-12 ENCOUNTER — Ambulatory Visit: Payer: 59 | Admitting: Medical-Surgical

## 2021-03-12 VITALS — BP 111/71 | HR 89 | Temp 98.7°F | Ht 66.75 in | Wt 194.5 lb

## 2021-03-12 DIAGNOSIS — E119 Type 2 diabetes mellitus without complications: Secondary | ICD-10-CM | POA: Diagnosis not present

## 2021-03-12 DIAGNOSIS — K219 Gastro-esophageal reflux disease without esophagitis: Secondary | ICD-10-CM

## 2021-03-12 DIAGNOSIS — H6123 Impacted cerumen, bilateral: Secondary | ICD-10-CM | POA: Diagnosis not present

## 2021-03-12 DIAGNOSIS — I10 Essential (primary) hypertension: Secondary | ICD-10-CM

## 2021-03-12 MED ORDER — ATORVASTATIN CALCIUM 20 MG PO TABS
20.0000 mg | ORAL_TABLET | Freq: Every day | ORAL | 5 refills | Status: DC
Start: 2021-03-12 — End: 2021-04-18

## 2021-03-12 MED ORDER — LISINOPRIL-HYDROCHLOROTHIAZIDE 20-12.5 MG PO TABS: ORAL_TABLET | ORAL | 5 refills | Status: DC

## 2021-03-12 NOTE — Progress Notes (Signed)
Subjective:    CC: UC/ED follow up  HPI: Pleasant 45 year old male presenting today for follow up from UC visit on 3/6 and ED visit on 3/11 for epigastric/chest pain. Notes that he was experiencing this pain intermittently with varying types and locations of discomfort. Pain not worsened or associated with certain activities or foods. Both UC and ED felt that pain was related to a GI issue. Chest x-ray done was clear. Cardiac workup also negative. Patient has kept a log for the last 2 weeks of symptoms that he has with him today. Taking Nexium 20mg  BID for the last 13 days. Was told to reduce to once daily after two weeks. Continues to have some intermittent chest pain but feels better overall since starting the medication. Denies fever, chills, nausea, vomiting, diarrhea, lower abdominal pain, melena, shortness of breath, diaphoresis, and pain radiating to the arms/neck/back. Since starting the Nexium, has had some constipation which has exacerbated his hemorrhoids.   I reviewed the past medical history, family history, social history, surgical history, and allergies today and no changes were needed.  Please see the problem list section below in epic for further details.  Past Medical History: Past Medical History:  Diagnosis Date  . COVID-19   . Diabetes mellitus without complication (HCC)   . Pre-hypertension   . Reflux    Past Surgical History: Past Surgical History:  Procedure Laterality Date  . NO PAST SURGERIES     Social History: Social History   Socioeconomic History  . Marital status: Single    Spouse name: Not on file  . Number of children: Not on file  . Years of education: Not on file  . Highest education level: Not on file  Occupational History  . Occupation:  Tobacco Use  . Smoking status: Never Smoker  . Smokeless tobacco: Never Used  Vaping Use  . Vaping Use: Never used  Substance and Sexual Activity  . Alcohol use: Yes    Comment: Rarely   . Drug use: No  . Sexual activity: Not on file  Other Topics Concern  . Not on file  Social History Narrative  . Not on file   Social Determinants of Health   Financial Resource Strain: Not on file  Food Insecurity: Not on file  Transportation Needs: Not on file  Physical Activity: Not on file  Stress: Not on file  Social Connections: Not on file   Family History: Family History  Problem Relation Age of Onset  . Hypertension Mother   . Hypertension Father   . Parkinsonism Father   . Cancer Neg Hx   . COPD Neg Hx   . Depression Neg Hx   . Hyperlipidemia Neg Hx   . Kidney disease Neg Hx   . Stroke Neg Hx    Allergies: Allergies  Allergen Reactions  . Latex Rash   Medications: See med rec.  Review of Systems: See HPI for pertinent positives and negatives.   Objective:    General: Well Developed, well nourished, and in no acute distress.  Neuro: Alert and oriented x3.  HEENT: Normocephalic, atraumatic. Bilateral external ear canals blocked by large amount of brown cerumen.  Skin: Warm and dry. Cardiac: Regular rate and rhythm, no murmurs rubs or gallops, no lower extremity edema.  Respiratory: Clear to auscultation bilaterally. Not using accessory muscles, speaking in full sentences. Abdomen: Soft, nontender, nondistended. Bowel sounds + x 4 quadrants. No HSM appreciated.  Impression and Recommendations:    1. Gastroesophageal reflux  disease, unspecified whether esophagitis present Continue Nexium 20mg  BID for another 2 weeks then reduce to once daily. Referring to GI for further evaluation given his history of frequent choking and esophageal stricture.  - Ambulatory referral to Gastroenterology  2. Essential hypertension Continue lisinopril-HCTZ 1 tab daily. Refills sent.  - lisinopril-hydrochlorothiazide (ZESTORETIC) 20-12.5 MG tablet; TAKE 1 TABLET BY MOUTH DAILY FOR BLOOD PRESSURE  Dispense: 30 tablet; Refill: 5  3. Diabetes mellitus without complication  (HCC) Continue Atorvastatin. Refills sent.  - atorvastatin (LIPITOR) 20 MG tablet; Take 1 tablet (20 mg total) by mouth daily.  Dispense: 30 tablet; Refill: 5  4. Bilateral impacted cerumen Bilateral irrigation completed without difficulty. TMs normal.   Return for follow up as scheduled. ___________________________________________ , DNP, APRN, FNP-BC Primary Care and Sports Medicine [facility] [place]

## 2021-03-21 ENCOUNTER — Encounter: Payer: Self-pay | Admitting: Medical-Surgical

## 2021-03-22 NOTE — Telephone Encounter (Addendum)
I called [facility] to inquire why patient has not been called I spoke with Olegario Messier and she stated they are at least 2 weeks behind on referrals and they would do their best to call the patient today. - CF

## 2021-03-30 LAB — HM DIABETES EYE EXAM

## 2021-04-02 ENCOUNTER — Encounter: Payer: Self-pay | Admitting: Medical-Surgical

## 2021-04-02 ENCOUNTER — Ambulatory Visit: Payer: 59 | Admitting: Medical-Surgical

## 2021-04-02 VITALS — BP 109/73 | HR 98 | Temp 97.9°F | Resp 20 | Ht 66.75 in | Wt 192.0 lb

## 2021-04-02 DIAGNOSIS — I10 Essential (primary) hypertension: Secondary | ICD-10-CM

## 2021-04-02 DIAGNOSIS — E119 Type 2 diabetes mellitus without complications: Secondary | ICD-10-CM

## 2021-04-02 DIAGNOSIS — K219 Gastro-esophageal reflux disease without esophagitis: Secondary | ICD-10-CM

## 2021-04-02 LAB — POCT GLYCOSYLATED HEMOGLOBIN (HGB A1C): Hemoglobin A1C: 6.4 % — AB (ref 4.0–5.6)

## 2021-04-02 NOTE — Progress Notes (Signed)
Subjective:    CC: HTN/DM follow up  HPI: Pleasant 45 year old male presenting for the following:  Hypertension-taking lisinopril-HCTZ 20-12.5 mg daily, tolerating well without side effects.  Admits that the last few months he has been less active due to the weather but his job is getting ready to pick up for spring and he will be much more active.  Avoiding added sodium in his diet. Denies CP, SOB, palpitations, lower extremity edema, dizziness, headaches, or vision changes.  DM-diet controlled and not currently on any medications.  Recently had a diabetic eye exam with no abnormal findings.  Taking atorvastatin for cholesterol management and diabetes.  GERD-taking Nexium daily as prescribed, tolerating well without side effects.  Notes that the chest pains he has been having in the last visit went away after 3 days on the medication and have not returned.  Has been working to get in with GI but had some difficulties with the referral process.  I reviewed the past medical history, family history, social history, surgical history, and allergies today and no changes were needed.  Please see the problem list section below in epic for further details.  Past Medical History: Past Medical History:  Diagnosis Date  . COVID-19   . Diabetes mellitus without complication (HCC)   . Pre-hypertension   . Reflux    Past Surgical History: Past Surgical History:  Procedure Laterality Date  . NO PAST SURGERIES     Social History: Social History   Socioeconomic History  . Marital status: Single    Spouse name: Not on file  . Number of children: Not on file  . Years of education: Not on file  . Highest education level: Not on file  Occupational History  . Occupation: Automotive engineer  Tobacco Use  . Smoking status: Never Smoker  . Smokeless tobacco: Never Used  Vaping Use  . Vaping Use: Never used  Substance and Sexual Activity  . Alcohol use: Yes    Comment: Rarely  . Drug use: No   . Sexual activity: Not on file  Other Topics Concern  . Not on file  Social History Narrative  . Not on file   Social Determinants of Health   Financial Resource Strain: Not on file  Food Insecurity: Not on file  Transportation Needs: Not on file  Physical Activity: Not on file  Stress: Not on file  Social Connections: Not on file   Family History: Family History  Problem Relation Age of Onset  . Hypertension Mother   . Hypertension Father   . Parkinsonism Father   . Cancer Neg Hx   . COPD Neg Hx   . Depression Neg Hx   . Hyperlipidemia Neg Hx   . Kidney disease Neg Hx   . Stroke Neg Hx    Allergies: Allergies  Allergen Reactions  . Latex Rash   Medications: See med rec.  Review of Systems: See HPI for pertinent positives and negatives.   Objective:    General: Well Developed, well nourished, and in no acute distress.  Neuro: Alert and oriented x3.  HEENT: Normocephalic, atraumatic.  Skin: Warm and dry. Cardiac: Regular rate and rhythm, no murmurs rubs or gallops, no lower extremity edema.  Respiratory: Clear to auscultation bilaterally. Not using accessory muscles, speaking in full sentences.   Impression and Recommendations:    1. Diabetes mellitus without complication (HCC) POCT hemoglobin A1c 6.4% today, up from 5.8%.  Discussed the importance of maintaining diet control with limited simple carbohydrates/concentrated sweets  as well as regular exercise. - POCT glycosylated hemoglobin (Hb A1C)  2. Essential hypertension Blood pressure at goal.  Continue lisinopril-HCTZ 20-12.5 mg daily.  3. Gastroesophageal reflux disease, unspecified whether esophagitis present Continue Nexium 40 mg daily as needed.  Sending FYI to referral coordinator to see if we can aid with getting him scheduled.  Return in about 6 months (around 10/02/2021) for chronic disease follow up. ___________________________________________ Thayer Ohm, DNP, APRN, FNP-BC Primary Care and  Sports Medicine [facility] [place]

## 2021-04-03 NOTE — Progress Notes (Signed)
Ok. Thanks. Not sure why they have to get the records to be evaluated given that this was a while back. Appreciate you looking into it.

## 2021-04-07 IMAGING — DX DG CHEST 2V
2 series · 2 of 2 positions shown · non-contrast
Comparison: PA and lateral chest 06/03/2019.

CLINICAL DATA: Left upper chest pain for 1 week.

EXAM:
CHEST - 2 VIEW

[chest pa]
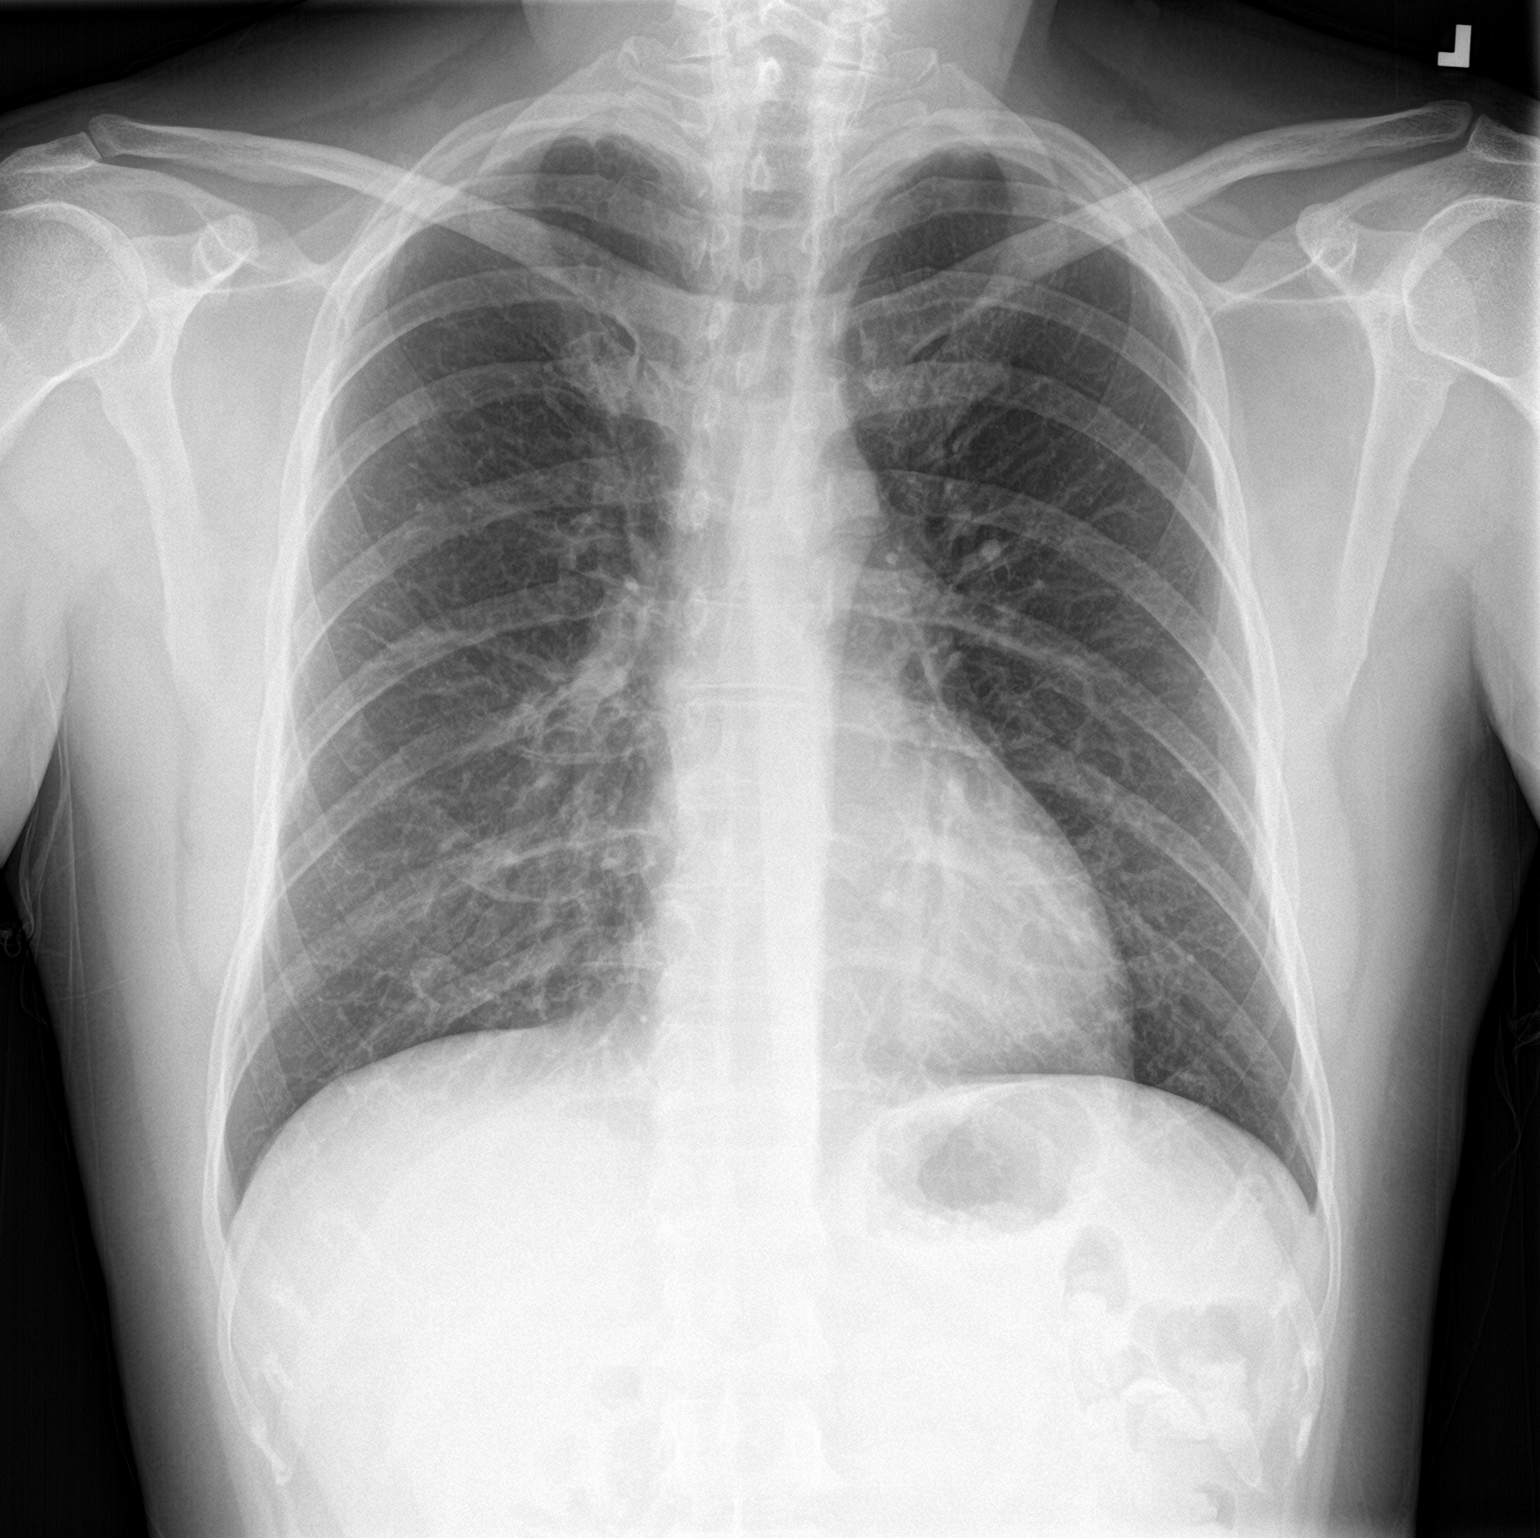

[chest lat]
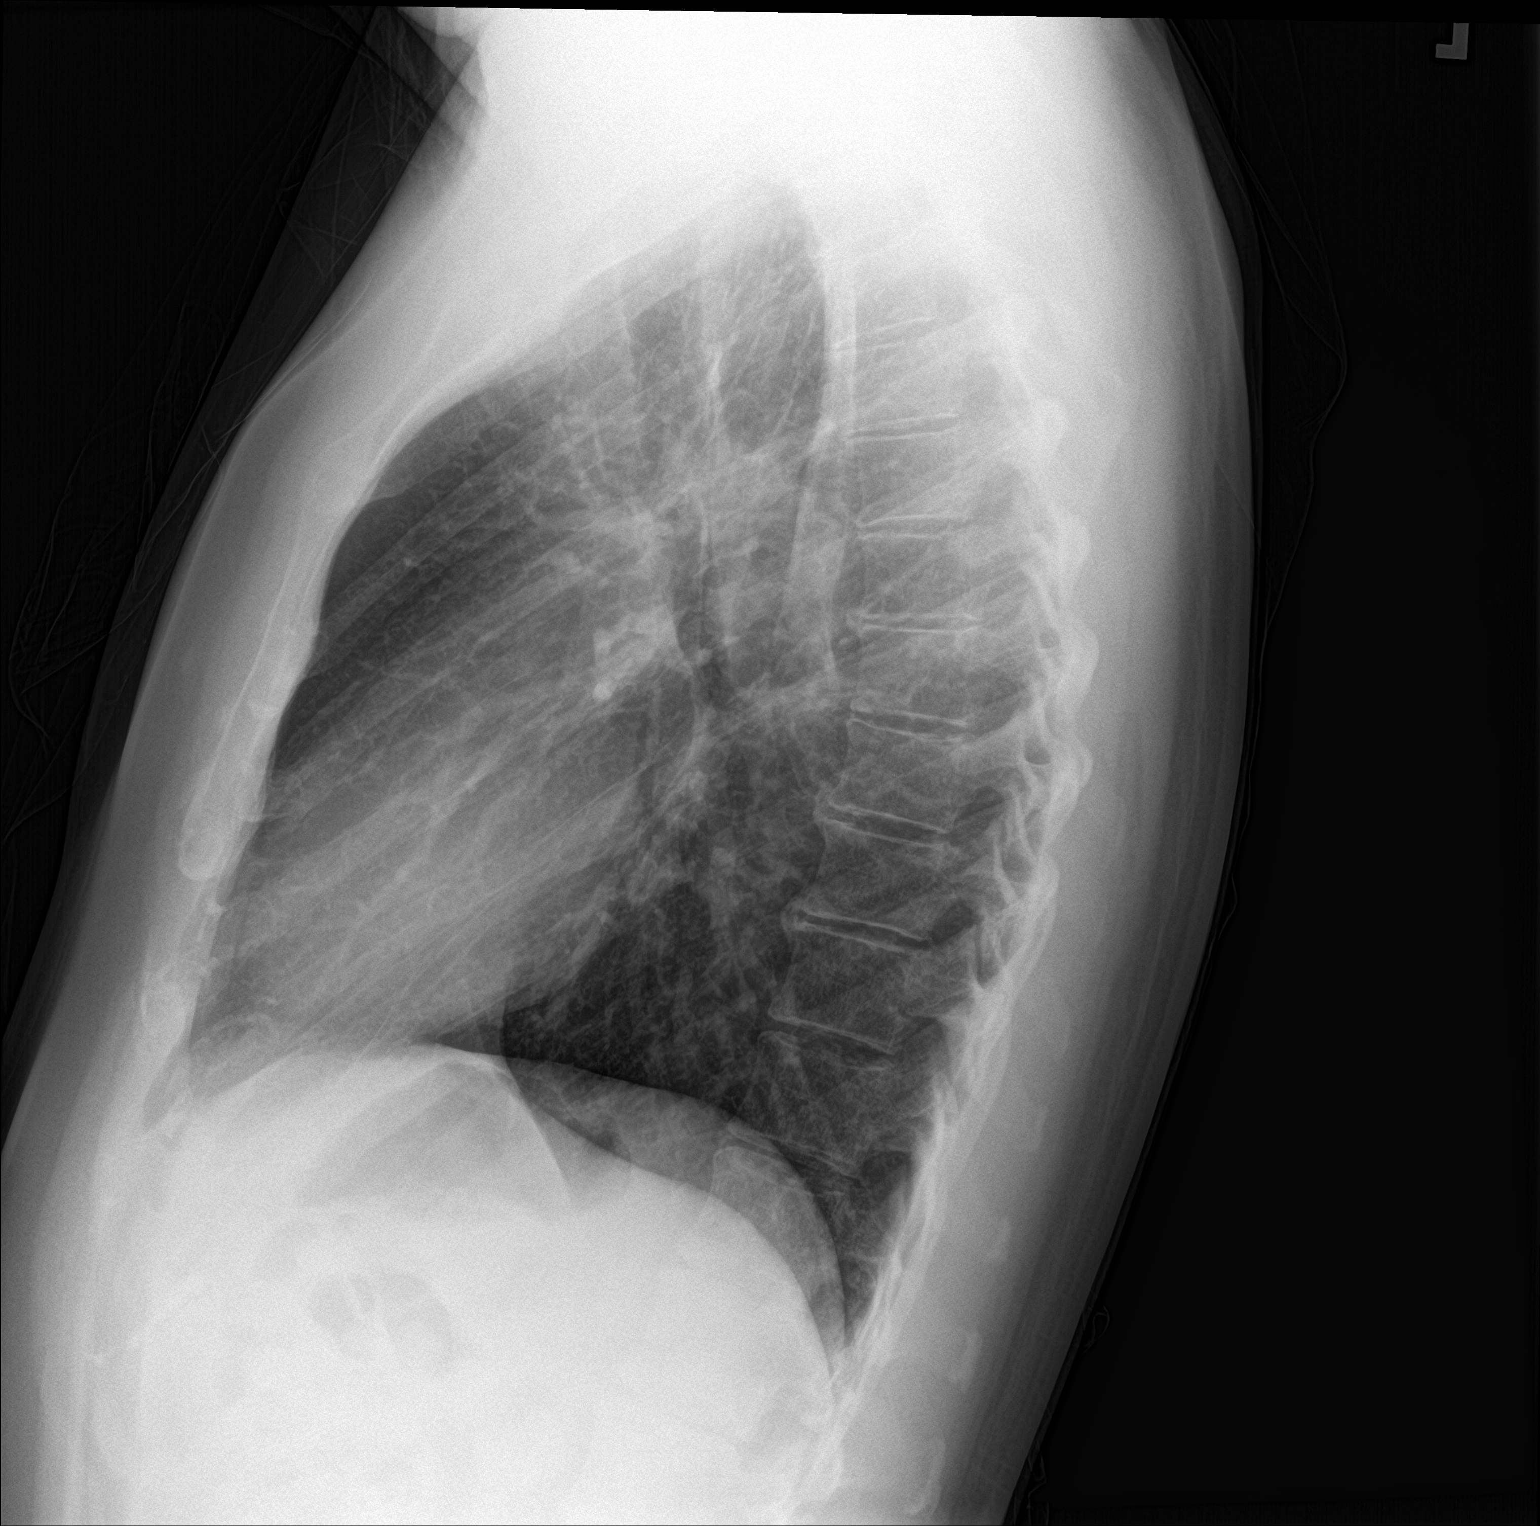

[2 of 2 positions shown; findings below may reference images not displayed]

FINDINGS: Lungs clear. Heart size normal. No pneumothorax or pleural fluid. No
acute or focal bony abnormality.
IMPRESSION: Negative chest.

## 2021-04-12 ENCOUNTER — Telehealth: Payer: Self-pay | Admitting: Gastroenterology

## 2021-04-12 NOTE — Telephone Encounter (Signed)
OK to schedule in APP clinic  records must be in transit  RG

## 2021-04-12 NOTE — Telephone Encounter (Signed)
Hey Dr Chales Abrahams, we received this referral from Primary Care for Gastroesophageal reflux disease, but it looks like the pt saw [facility] on 05/2020. I will send the records to you for review, please advise on scheduling.

## 2021-04-18 ENCOUNTER — Other Ambulatory Visit: Payer: Self-pay

## 2021-04-18 ENCOUNTER — Encounter: Payer: Self-pay | Admitting: Medical-Surgical

## 2021-04-18 DIAGNOSIS — E119 Type 2 diabetes mellitus without complications: Secondary | ICD-10-CM

## 2021-04-18 DIAGNOSIS — I10 Essential (primary) hypertension: Secondary | ICD-10-CM

## 2021-04-18 MED ORDER — LISINOPRIL-HYDROCHLOROTHIAZIDE 20-12.5 MG PO TABS: ORAL_TABLET | ORAL | 1 refills | Status: DC

## 2021-04-18 MED ORDER — ESOMEPRAZOLE MAGNESIUM 20 MG PO CPDR: 20.0000 mg | DELAYED_RELEASE_CAPSULE | Freq: Every day | ORAL | 1 refills | Status: DC | PRN

## 2021-04-18 MED ORDER — ATORVASTATIN CALCIUM 20 MG PO TABS: 20.0000 mg | ORAL_TABLET | Freq: Every day | ORAL | 1 refills | Status: DC

## 2021-04-20 ENCOUNTER — Telehealth: Payer: Self-pay

## 2021-04-20 NOTE — Telephone Encounter (Signed)
PA for esomeprazole 20 mg DR cap submitted through [email or web address] PA is pending

## 2021-04-23 ENCOUNTER — Encounter: Payer: Self-pay | Admitting: Medical-Surgical

## 2021-04-23 NOTE — Telephone Encounter (Signed)
Notification received from Cover[employer]Meds regarding PA for esopmerazole 20 mg  PA has been denied.

## 2021-04-24 ENCOUNTER — Encounter: Payer: Self-pay | Admitting: Physician Assistant

## 2021-05-10 ENCOUNTER — Ambulatory Visit: Payer: 59 | Admitting: Physician Assistant

## 2021-05-23 DIAGNOSIS — Z789 Other specified health status: Secondary | ICD-10-CM | POA: Insufficient documentation

## 2021-05-24 DIAGNOSIS — E785 Hyperlipidemia, unspecified: Secondary | ICD-10-CM | POA: Insufficient documentation

## 2021-05-24 DIAGNOSIS — E663 Overweight: Secondary | ICD-10-CM | POA: Insufficient documentation

## 2021-06-11 ENCOUNTER — Ambulatory Visit: Payer: 59 | Admitting: Physician Assistant

## 2021-06-22 ENCOUNTER — Other Ambulatory Visit: Payer: Self-pay

## 2021-06-22 ENCOUNTER — Emergency Department
Admission: EM | Admit: 2021-06-22 | Discharge: 2021-06-22 | Disposition: A | Discharge status: Home / Self Care | Payer: 59 | Source: Home / Self Care

## 2021-06-22 DIAGNOSIS — R350 Frequency of micturition: Secondary | ICD-10-CM

## 2021-06-22 LAB — POCT URINALYSIS DIP (MANUAL ENTRY)
Bilirubin, UA: NEGATIVE
Blood, UA: NEGATIVE
Glucose, UA: NEGATIVE mg/dL
Ketones, POC UA: NEGATIVE mg/dL
Leukocytes, UA: NEGATIVE
Nitrite, UA: NEGATIVE
Protein Ur, POC: NEGATIVE mg/dL
Spec Grav, UA: <=1.005 — AB (ref 1.010–1.025)
Urobilinogen, UA: 0.2 E.U./dL
pH, UA: 5.5 (ref 5.0–8.0)

## 2021-06-22 NOTE — ED Triage Notes (Addendum)
Pt c/o lower groin pain (near perineum) that returned last night from a week ago. Also had an increase in urinary frequency. Hx of prostatitis and epididymitis 15 years ago. Pain 1/10

## 2021-06-22 NOTE — ED Provider Notes (Signed)
[facility]    CSN: [id number] Arrival date & time: 06/22/21  1606      History   Chief Complaint Chief Complaint  Patient presents with   Groin Pain    HPI Robert Glover is a 45 y.o. male.   HPI 45 year old male presents with lower groin pain/perineum for 1 week, reports increase in urinary frequency over this duration as well.  Reports PMH of prostatitis and epididymitis.  Prior to exam patient reports pain is intermittent over the past week and currently has no pain of bilateral groin or perineum.  Reports sometimes when he gets up and walks around pain will resolve on its own.  Denies fever, abdominal pain, or flank pain.  Past Medical History:  Diagnosis Date   COVID-19    Diabetes mellitus without complication (HCC)    Pre-hypertension    Reflux     Patient Active Problem List   Diagnosis Date Noted   Diabetes mellitus without complication (HCC) 01/21/2020   BACK PAIN, LUMBAR 05/29/2010   Essential hypertension 11/20/2009   Gastroesophageal reflux disease without esophagitis 11/20/2009   CHEST PAIN UNSPECIFIED 11/20/2009    Past Surgical History:  Procedure Laterality Date   NO PAST SURGERIES         Home Medications    Prior to Admission medications   Medication Sig Start Date End Date Taking? Authorizing Provider  omeprazole (PRILOSEC) 40 MG capsule Take by mouth. 05/14/21  Yes [provider]  atorvastatin (LIPITOR) 20 MG tablet Take 1 tablet (20 mg total) by mouth daily. 04/18/21   Christen Butter, NP  esomeprazole (NEXIUM) 20 MG capsule Take 1 capsule (20 mg total) by mouth daily as needed. 04/18/21   Christen Butter, NP  lisinopril-hydrochlorothiazide (ZESTORETIC) 20-12.5 MG tablet TAKE 1 TABLET BY MOUTH DAILY FOR BLOOD PRESSURE 04/18/21   Christen Butter, NP    Family History Family History  Problem Relation Age of Onset   Hypertension Mother    Hypertension Father    Parkinsonism Father    Cancer Neg Hx    COPD Neg Hx     Depression Neg Hx    Hyperlipidemia Neg Hx    Kidney disease Neg Hx    Stroke Neg Hx     Social History Social History   Tobacco Use   Smoking status: Never   Smokeless tobacco: Never  Vaping Use   Vaping Use: Never used  Substance Use Topics   Alcohol use: Yes    Comment: Rarely   Drug use: No     Allergies   Latex   Review of Systems Review of Systems  Constitutional: Negative.   HENT: Negative.    Eyes: Negative.   Respiratory: Negative.    Cardiovascular: Negative.   Gastrointestinal: Negative.   Genitourinary:  Positive for urgency.       Bilateral groin/perineal pain for 1 week  Musculoskeletal: Negative.   Skin: Negative.   Neurological: Negative.     Physical Exam Triage Vital Signs ED Triage Vitals  Enc Vitals Group     BP 06/22/21 1618 129/87     Pulse Rate 06/22/21 1618 89     Resp 06/22/21 1618 18     Temp 06/22/21 1618 98.3 F (36.8 C)     Temp Source 06/22/21 1618 Oral     SpO2 06/22/21 1618 100 %     Weight --      Height --      Head Circumference --  Peak Flow --      Pain Score 06/22/21 1619 1     Pain Loc --      Pain Edu? --      Excl. in GC? --    No data found.  Updated Vital Signs BP 129/87 (BP Location: Right Arm)   Pulse 89   Temp 98.3 F (36.8 C) (Oral)   Resp 18   SpO2 100%   Physical Exam Vitals and nursing note reviewed.  Constitutional:      Appearance: Normal appearance. He is normal weight.  HENT:     Head: Normocephalic and atraumatic.     Mouth/Throat:     Mouth: Mucous membranes are moist.     Pharynx: Oropharynx is clear.  Eyes:     Extraocular Movements: Extraocular movements intact.     Conjunctiva/sclera: Conjunctivae normal.     Pupils: Pupils are equal, round, and reactive to light.  Cardiovascular:     Rate and Rhythm: Normal rate and regular rhythm.     Pulses: Normal pulses.     Heart sounds: Normal heart sounds.  Pulmonary:     Effort: Pulmonary effort is normal. No respiratory  distress.     Breath sounds: Normal breath sounds. No wheezing, rhonchi or rales.  Abdominal:     Tenderness: There is no right CVA tenderness or left CVA tenderness.  Musculoskeletal:        General: Normal range of motion.     Cervical back: Normal range of motion and neck supple.  Skin:    General: Skin is warm and dry.  Neurological:     General: No focal deficit present.     Mental Status: He is alert and oriented to person, place, and time.  Psychiatric:        Mood and Affect: Mood normal.        Behavior: Behavior normal.     UC Treatments / Results  Labs (all labs ordered are listed, but only abnormal results are displayed) Labs Reviewed  POCT URINALYSIS DIP (MANUAL ENTRY) - Abnormal; Notable for the following components:      Result Value   Spec Grav, UA <=1.005 (*)    All other components within normal limits  URINE CULTURE    EKG   Radiology No results found.  Procedures Procedures (including critical care time)  Medications Ordered in UC Medications - No data to display  Initial Impression / Assessment and Plan / UC Course  I have reviewed the triage vital signs and the nursing notes.  Pertinent labs & imaging results that were available during my care of the patient were reviewed by me and considered in my medical decision making (see chart for details).     MDM: 1.  Urinary frequency.  Patient discharged home, hemodynamically stable.  Final Clinical Impressions(s) / UC Diagnoses   Final diagnoses:  Urinary frequency     Discharge Instructions      Advised patient that we would follow-up with him once urine culture results return.  Advised patient if he experiences dysuria, hematuria, hematospermia, and/or testicular/scrotum pain to follow-up with PCP or here for further evaluation.     ED Prescriptions   None    PDMP not reviewed this encounter.   Trevor Iha, FNP 06/22/21 1726

## 2021-06-22 NOTE — Discharge Instructions (Addendum)
Advised patient that we would follow-up with him once urine culture results return.  Advised patient if he experiences dysuria, hematuria, hematospermia, and/or testicular/scrotum pain to follow-up with PCP or here for further evaluation.

## 2021-06-24 LAB — URINE CULTURE
MICRO NUMBER:: 12049713
Result:: NO GROWTH
SPECIMEN QUALITY:: ADEQUATE

## 2021-06-29 ENCOUNTER — Ambulatory Visit: Payer: 59 | Admitting: Family Medicine

## 2021-06-29 ENCOUNTER — Other Ambulatory Visit: Payer: Self-pay

## 2021-06-29 ENCOUNTER — Encounter: Payer: Self-pay | Admitting: Family Medicine

## 2021-06-29 ENCOUNTER — Ambulatory Visit (INDEPENDENT_AMBULATORY_CARE_PROVIDER_SITE_OTHER): Payer: 59

## 2021-06-29 ENCOUNTER — Other Ambulatory Visit: Payer: Self-pay | Admitting: Family Medicine

## 2021-06-29 VITALS — BP 133/84 | HR 78 | Temp 98.3°F | Resp 20 | Ht 66.75 in | Wt 184.0 lb

## 2021-06-29 DIAGNOSIS — R109 Unspecified abdominal pain: Secondary | ICD-10-CM

## 2021-06-29 DIAGNOSIS — R350 Frequency of micturition: Secondary | ICD-10-CM | POA: Diagnosis not present

## 2021-06-29 DIAGNOSIS — R10A2 Flank pain, left side: Secondary | ICD-10-CM

## 2021-06-29 DIAGNOSIS — R1030 Lower abdominal pain, unspecified: Secondary | ICD-10-CM

## 2021-06-29 LAB — POCT URINALYSIS DIP (CLINITEK)
Bilirubin, UA: NEGATIVE
Blood, UA: NEGATIVE
Glucose, UA: NEGATIVE mg/dL
Leukocytes, UA: NEGATIVE
Nitrite, UA: NEGATIVE
POC PROTEIN,UA: NEGATIVE
Spec Grav, UA: 1.015 (ref 1.010–1.025)
Urobilinogen, UA: 0.2 E.U./dL
pH, UA: 7 (ref 5.0–8.0)

## 2021-06-29 MED ORDER — SULFAMETHOXAZOLE-TRIMETHOPRIM 800-160 MG PO TABS: 1.0000 | ORAL_TABLET | Freq: Two times a day (BID) | ORAL | 0 refills | Status: AC

## 2021-06-29 NOTE — Progress Notes (Signed)
Robert Glover - 45 y.o. male MRN [id number]  Date of birth: [birth year]  Subjective No chief complaint on file.   HPI Robert Glover is a 45 year old male here today with complaint of groin pain.  He was seen about 1-1/2 weeks ago at our urgent care with complaint of urinary frequency in addition to lower groin/perineal pain.  He does have a history of prostatitis as well as epididymitis.  He feels like pain improves if he gets up and walks around sometimes.  He has not had any blood in his urine, nausea, vomiting or dysuria.  No pain with ejaculation.  He denies any possible STI exposure.  ROS:  A comprehensive ROS was completed and negative except as noted per HPI  Allergies  Allergen Reactions   Latex Rash    Past Medical History:  Diagnosis Date   COVID-19    Diabetes mellitus without complication (HCC)    Pre-hypertension    Reflux     Past Surgical History:  Procedure Laterality Date   NO PAST SURGERIES      Social History   Socioeconomic History   Marital status: Single    Spouse name: Not on file   Number of children: Not on file   Years of education: Not on file   Highest education level: Not on file  Occupational History   Occupation: Automotive engineer  Tobacco Use   Smoking status: Never   Smokeless tobacco: Never  Vaping Use   Vaping Use: Never used  Substance and Sexual Activity   Alcohol use: Yes    Comment: Rarely   Drug use: No   Sexual activity: Not on file  Other Topics Concern   Not on file  Social History Narrative   Not on file   Social Determinants of Health   Financial Resource Strain: Not on file  Food Insecurity: Not on file  Transportation Needs: Not on file  Physical Activity: Not on file  Stress: Not on file  Social Connections: Not on file    Family History  Problem Relation Age of Onset   Hypertension Mother    Hypertension Father    Parkinsonism Father    Cancer Neg Hx    COPD Neg Hx    Depression Neg Hx     Hyperlipidemia Neg Hx    Kidney disease Neg Hx    Stroke Neg Hx     Health Maintenance  Topic Date Due   HIV Screening  Never done   FOOT EXAM  01/20/2021   OPHTHALMOLOGY EXAM  03/13/2021   INFLUENZA VACCINE  07/30/2021   HEMOGLOBIN A1C  10/02/2021   TETANUS/TDAP  01/20/2030   PNEUMOCOCCAL POLYSACCHARIDE VACCINE AGE 43-64 HIGH RISK  Completed   COVID-19 Vaccine  Completed   Hepatitis C Screening  Completed   Pneumococcal Vaccine 59-18 Years old  Aged Out   HPV VACCINES  Aged Out     ----------------------------------------------------------------------------------------------------------------------------------------------------------------------------------------------------------------- Physical Exam BP 133/84 (BP Location: Left Arm, Patient Position: Sitting, Cuff Size: Large)   Pulse 78   Temp 98.3 F (36.8 C) (Oral)   Resp 20   Ht 5' 6.75" (1.695 m)   Wt 184 lb (83.5 kg)   SpO2 99%   BMI 29.03 kg/m   Physical Exam Constitutional:      Appearance: Normal appearance.  HENT:     Head: Normocephalic and atraumatic.  Cardiovascular:     Rate and Rhythm: Normal rate and regular rhythm.  Pulmonary:     Effort: Pulmonary effort is  normal.     Breath sounds: Normal breath sounds.  Abdominal:     General: Abdomen is flat. There is no distension.     Palpations: Abdomen is soft. There is no mass.     Tenderness: There is no abdominal tenderness.  Genitourinary:    Penis: Normal.      Testes: Normal.  Musculoskeletal:     Cervical back: Neck supple.  Skin:    General: Skin is warm and dry.     Findings: No rash.  Neurological:     Mental Status: He is alert.  Psychiatric:        Mood and Affect: Mood normal.        Behavior: Behavior normal.     ------------------------------------------------------------------------------------------------------------------------------------------------------------------------------------------------------------------- Assessment and Plan  Discomfort of groin His urinary frequency along with perineal/groin pain is concerning for prostatitis.  I am sending a urine culture and checking PSA level.  I will go ahead and cover him empirically with Bactrim.  He denies any possible STI exposure.  I have also ordered a KUB to evaluate for a possible stone that may be contributing or causing the symptoms.  He is instructed to follow-up if symptoms worsen significantly.   No orders of the defined types were placed in this encounter.   No follow-ups on file.    This visit occurred during the SARS-CoV-2 public health emergency.  Safety protocols were in place, including screening questions prior to the visit, additional usage of staff PPE, and extensive cleaning of exam room while observing appropriate contact time as indicated for disinfecting solutions.

## 2021-06-29 NOTE — Patient Instructions (Signed)
I am going to go ahead and send medication in for prostatitis.  Have xray completed.  We'll be in touch with results.

## 2021-06-30 LAB — PSA: PSA: 0.89 ng/mL (ref <=4.00)

## 2021-07-01 ENCOUNTER — Encounter: Payer: Self-pay | Admitting: Family Medicine

## 2021-07-01 DIAGNOSIS — R1030 Lower abdominal pain, unspecified: Secondary | ICD-10-CM | POA: Insufficient documentation

## 2021-07-01 NOTE — Assessment & Plan Note (Addendum)
His urinary frequency along with perineal/groin pain is concerning for prostatitis.  I am sending a urine culture and checking PSA level.  I will go ahead and cover him empirically with Bactrim.  He denies any possible STI exposure.  I have also ordered a KUB to evaluate for a possible stone that may be contributing or causing the symptoms.  He is instructed to follow-up if symptoms worsen significantly.

## 2021-07-03 ENCOUNTER — Encounter: Payer: Self-pay | Admitting: Family Medicine

## 2021-07-03 LAB — URINE CULTURE
MICRO NUMBER:: 12077922
Result:: NO GROWTH
SPECIMEN QUALITY:: ADEQUATE

## 2021-07-03 LAB — HOUSE ACCOUNT TRACKING

## 2021-07-05 ENCOUNTER — Other Ambulatory Visit: Payer: Self-pay | Admitting: Family Medicine

## 2021-07-05 MED ORDER — ONDANSETRON 4 MG PO TBDP: 4.0000 mg | ORAL_TABLET | Freq: Three times a day (TID) | ORAL | 0 refills | Status: DC | PRN

## 2021-07-10 ENCOUNTER — Ambulatory Visit: Payer: 59 | Admitting: Medical-Surgical

## 2021-07-10 ENCOUNTER — Other Ambulatory Visit: Payer: Self-pay

## 2021-07-10 ENCOUNTER — Encounter: Payer: Self-pay | Admitting: Medical-Surgical

## 2021-07-10 VITALS — BP 113/72 | HR 98 | Temp 98.9°F | Ht 66.75 in | Wt 181.0 lb

## 2021-07-10 DIAGNOSIS — I10 Essential (primary) hypertension: Secondary | ICD-10-CM

## 2021-07-10 DIAGNOSIS — R55 Syncope and collapse: Secondary | ICD-10-CM | POA: Diagnosis not present

## 2021-07-10 DIAGNOSIS — R7989 Other specified abnormal findings of blood chemistry: Secondary | ICD-10-CM | POA: Diagnosis not present

## 2021-07-10 MED ORDER — LISINOPRIL 10 MG PO TABS: 10.0000 mg | ORAL_TABLET | Freq: Every day | ORAL | 3 refills | Status: DC

## 2021-07-10 NOTE — Progress Notes (Signed)
Subjective:    CC: dizziness  HPI: Pleasant 45 year old male presenting today for discussion about dizziness. Notes that he has had a very busy month with a severe flare of hemorrhoids, prostatitis, and evaluation by cardiology. Over the past 6 weeks he has noted a gradual reduction in his blood pressure at time that is accompanied by lightheadedness and some nausea. When his DBP is less than 60, he experiences these symptoms along with feeling wiped out. Has been monitoring his BP at home with readings between 98-120s/58-80s. He has made multiple changes in his diet and lifestyle. This is the busy time of the year for his job and he is walking and active most of the day. Drinking at least 64 ounces of water each day, sometimes more if he is outside a lot in the heat. This morning had an episode of dizziness that turned into near syncope. He ate some saltine crackers and has tried to get salty foods on board. Since he ate the salty foods, he has started to feel better. He is taking Lisinopril-HCTZ 20-12.5mg  daily along with prn Zofran. Checking his sugar regularly with a range of 85-low 100s. Did see his sugar up to 170 after eating a special dinner but otherwise no significant highs or lows to explain symptoms.   I reviewed the past medical history, family history, social history, surgical history, and allergies today and no changes were needed.  Please see the problem list section below in epic for further details.  Past Medical History: Past Medical History:  Diagnosis Date   COVID-19    Diabetes mellitus without complication (HCC)    Pre-hypertension    Reflux    Past Surgical History: Past Surgical History:  Procedure Laterality Date   NO PAST SURGERIES     Social History: Social History   Socioeconomic History   Marital status: Single    Spouse name: Not on file   Number of children: Not on file   Years of education: Not on file   Highest education level: Not on file   Occupational History   Occupation: Automotive engineer  Tobacco Use   Smoking status: Never   Smokeless tobacco: Never  Vaping Use   Vaping Use: Never used  Substance and Sexual Activity   Alcohol use: Yes    Comment: Rarely   Drug use: No   Sexual activity: Not on file  Other Topics Concern   Not on file  Social History Narrative   Not on file   Social Determinants of Health   Financial Resource Strain: Not on file  Food Insecurity: Not on file  Transportation Needs: Not on file  Physical Activity: Not on file  Stress: Not on file  Social Connections: Not on file   Family History: Family History  Problem Relation Age of Onset   Hypertension Mother    Hypertension Father    Parkinsonism Father    Cancer Neg Hx    COPD Neg Hx    Depression Neg Hx    Hyperlipidemia Neg Hx    Kidney disease Neg Hx    Stroke Neg Hx    Allergies: Allergies  Allergen Reactions   Latex Rash   Medications: See med rec.  Review of Systems: See HPI for pertinent positives and negatives.   Objective:    General: Well Developed, well nourished, and in no acute distress.  Neuro: Alert and oriented x3.  HEENT: Normocephalic, atraumatic.  Skin: Warm and dry. Cardiac: Regular rate and rhythm, no murmurs  rubs or gallops, no lower extremity edema.  Respiratory: Clear to auscultation bilaterally. Not using accessory muscles, speaking in full sentences.  Impression and Recommendations:    1. Essential hypertension 2. Near syncope Orthostatic vitals completed with stable blood pressure and a mild increase in pulse from supine to standing. Hydration status looks good. Suspect weight loss, exercise, and dietary changes have reduced his blood pressure. Checking labs below. Changing medication to Lisinopril 10mg  daily. Discontinue Lisinopril-HCTZ combination. Monitor blood pressures at home. I will reach out via MyChart in 2 weeks to see how his BPs are running and if his symptoms has resolved.   - CBC with Differential/Platelet - COMPLETE METABOLIC PANEL WITH GFR -TSH  Return in about 3 months (around 10/10/2021) for DM/HTN/HLD follow up.  ___________________________________________ 12/10/2021, DNP, APRN, FNP-BC Primary Care and Sports Medicine [facility] [place]

## 2021-07-12 ENCOUNTER — Encounter: Payer: Self-pay | Admitting: Medical-Surgical

## 2021-07-12 LAB — COMPLETE METABOLIC PANEL WITH GFR
AG Ratio: 1.8 (calc) (ref 1.0–2.5)
ALT: 29 U/L (ref 9–46)
AST: 21 U/L (ref 10–40)
Albumin: 4.8 g/dL (ref 3.6–5.1)
Alkaline phosphatase (APISO): 60 U/L (ref 36–130)
BUN/Creatinine Ratio: 11 (calc) (ref 6–22)
BUN: 18 mg/dL (ref 7–25)
CO2: 28 mmol/L (ref 20–32)
Calcium: 9.8 mg/dL (ref 8.6–10.3)
Chloride: 97 mmol/L — ABNORMAL LOW (ref 98–110)
Creat: 1.71 mg/dL — ABNORMAL HIGH (ref 0.60–1.29)
Globulin: 2.6 g/dL (calc) (ref 1.9–3.7)
Glucose, Bld: 90 mg/dL (ref 65–99)
Potassium: 4.5 mmol/L (ref 3.5–5.3)
Sodium: 135 mmol/L (ref 135–146)
Total Bilirubin: 0.7 mg/dL (ref 0.2–1.2)
Total Protein: 7.4 g/dL (ref 6.1–8.1)
eGFR: 50 mL/min/{1.73_m2} — ABNORMAL LOW (ref >=60)

## 2021-07-12 LAB — TSH: TSH: 4.62 mIU/L — ABNORMAL HIGH (ref 0.40–4.50)

## 2021-07-12 LAB — CBC WITH DIFFERENTIAL/PLATELET
Absolute Monocytes: 389 cells/uL (ref 200–950)
Basophils Absolute: 19 cells/uL (ref 0–200)
Basophils Relative: 0.9 %
Eosinophils Absolute: 130 cells/uL (ref 15–500)
Eosinophils Relative: 6.2 %
HCT: 44.9 % (ref 38.5–50.0)
Hemoglobin: 15.3 g/dL (ref 13.2–17.1)
Lymphs Abs: 716 cells/uL — ABNORMAL LOW (ref 850–3900)
MCH: 30 pg (ref 27.0–33.0)
MCHC: 34.1 g/dL (ref 32.0–36.0)
MCV: 88 fL (ref 80.0–100.0)
MPV: 12.1 fL (ref 7.5–12.5)
Monocytes Relative: 18.5 %
Neutro Abs: 846 cells/uL — ABNORMAL LOW (ref 1500–7800)
Neutrophils Relative %: 40.3 %
Platelets: 55 10*3/uL — ABNORMAL LOW (ref 140–400)
RBC: 5.1 10*6/uL (ref 4.20–5.80)
RDW: 13 % (ref 11.0–15.0)
Total Lymphocyte: 34.1 %
WBC: 2.1 10*3/uL — ABNORMAL LOW (ref 3.8–10.8)

## 2021-07-12 LAB — T3, FREE: T3, Free: 2.8 pg/mL (ref 2.3–4.2)

## 2021-07-12 LAB — T4, FREE: Free T4: 1.1 ng/dL (ref 0.8–1.8)

## 2021-07-12 NOTE — Addendum Note (Signed)
Addended byChristen Butter on: 07/12/2021 01:15 PM   Modules accepted: Orders

## 2021-07-19 ENCOUNTER — Other Ambulatory Visit: Payer: Self-pay

## 2021-07-19 DIAGNOSIS — D729 Disorder of white blood cells, unspecified: Secondary | ICD-10-CM

## 2021-07-19 DIAGNOSIS — N289 Disorder of kidney and ureter, unspecified: Secondary | ICD-10-CM

## 2021-07-19 LAB — COMPLETE METABOLIC PANEL WITH GFR
AG Ratio: 2 (calc) (ref 1.0–2.5)
ALT: 28 U/L (ref 9–46)
AST: 20 U/L (ref 10–40)
Albumin: 4.7 g/dL (ref 3.6–5.1)
Alkaline phosphatase (APISO): 51 U/L (ref 36–130)
BUN: 13 mg/dL (ref 7–25)
CO2: 27 mmol/L (ref 20–32)
Calcium: 9.8 mg/dL (ref 8.6–10.3)
Chloride: 105 mmol/L (ref 98–110)
Creat: 1.29 mg/dL (ref 0.60–1.29)
Globulin: 2.3 g/dL (calc) (ref 1.9–3.7)
Glucose, Bld: 93 mg/dL (ref 65–99)
Potassium: 5.2 mmol/L (ref 3.5–5.3)
Sodium: 140 mmol/L (ref 135–146)
Total Bilirubin: 0.6 mg/dL (ref 0.2–1.2)
Total Protein: 7 g/dL (ref 6.1–8.1)
eGFR: 70 mL/min/{1.73_m2} (ref >=60)

## 2021-07-19 LAB — CBC WITH DIFFERENTIAL/PLATELET
Absolute Monocytes: 381 cells/uL (ref 200–950)
Basophils Absolute: 19 cells/uL (ref 0–200)
Basophils Relative: 0.4 %
Eosinophils Absolute: 423 cells/uL (ref 15–500)
Eosinophils Relative: 9 %
HCT: 43.6 % (ref 38.5–50.0)
Hemoglobin: 14.6 g/dL (ref 13.2–17.1)
Lymphs Abs: 1589 cells/uL (ref 850–3900)
MCH: 29.4 pg (ref 27.0–33.0)
MCHC: 33.5 g/dL (ref 32.0–36.0)
MCV: 87.7 fL (ref 80.0–100.0)
MPV: 10.5 fL (ref 7.5–12.5)
Monocytes Relative: 8.1 %
Neutro Abs: 2289 cells/uL (ref 1500–7800)
Neutrophils Relative %: 48.7 %
Platelets: 259 10*3/uL (ref 140–400)
RBC: 4.97 10*6/uL (ref 4.20–5.80)
RDW: 13.2 % (ref 11.0–15.0)
Total Lymphocyte: 33.8 %
WBC: 4.7 10*3/uL (ref 3.8–10.8)

## 2021-07-19 NOTE — Progress Notes (Signed)
Ordered labs

## 2021-07-20 LAB — TSH: TSH: 3.77 mIU/L (ref 0.40–4.50)

## 2021-09-05 ENCOUNTER — Encounter: Payer: Self-pay | Admitting: Medical-Surgical

## 2021-09-05 ENCOUNTER — Other Ambulatory Visit: Payer: Self-pay | Admitting: Medical-Surgical

## 2021-09-05 DIAGNOSIS — I10 Essential (primary) hypertension: Secondary | ICD-10-CM

## 2021-09-05 MED ORDER — LISINOPRIL-HYDROCHLOROTHIAZIDE 20-12.5 MG PO TABS: 1.0000 | ORAL_TABLET | Freq: Every day | ORAL | 0 refills | Status: DC

## 2021-10-08 ENCOUNTER — Ambulatory Visit: Payer: 59 | Admitting: Medical-Surgical

## 2021-10-08 ENCOUNTER — Encounter: Payer: Self-pay | Admitting: Medical-Surgical

## 2021-10-08 VITALS — BP 130/75 | HR 80 | Resp 20 | Ht 66.75 in | Wt 177.1 lb

## 2021-10-08 DIAGNOSIS — Z23 Encounter for immunization: Secondary | ICD-10-CM | POA: Diagnosis not present

## 2021-10-08 DIAGNOSIS — I10 Essential (primary) hypertension: Secondary | ICD-10-CM

## 2021-10-08 DIAGNOSIS — E119 Type 2 diabetes mellitus without complications: Secondary | ICD-10-CM

## 2021-10-08 LAB — POCT GLYCOSYLATED HEMOGLOBIN (HGB A1C): HbA1c, POC (controlled diabetic range): 5.6 % (ref 0.0–7.0)

## 2021-10-08 NOTE — Progress Notes (Signed)
  HPI with pertinent ROS:   CC: Diabetes/hypertension follow-up  HPI: Pleasant 45 year old male presenting today for to follow-up:  Diabetes-has been diet and exercise controlled for quite a while.  His last A1c was up a bit and he is worried today that it may still be up since he recently had some celebrations as well as a [employer] cruise.  He continues to exercise every day and makes healthy dietary choices for the most part.  Taking atorvastatin 20 mg daily as prescribed, tolerating well without side effects.  Hypertension-taking lisinopril-hydrochlorothiazide 20-12.5 mg daily as prescribed, tolerating well without side effects. Denies CP, SOB, palpitations, lower extremity edema, dizziness, headaches, or vision changes.  I reviewed the past medical history, family history, social history, surgical history, and allergies today and no changes were needed.  Please see the problem list section below in epic for further details.   Physical exam:   General: Well Developed, well nourished, and in no acute distress.  Neuro: Alert and oriented x3.  HEENT: Normocephalic, atraumatic.  Skin: Warm and dry. Cardiac: Regular rate and rhythm, no murmurs rubs or gallops, no lower extremity edema.  Respiratory: Clear to auscultation bilaterally. Not using accessory muscles, speaking in full sentences.  Impression and Recommendations:    1. Diabetes mellitus without complication (HCC) POCT hemoglobin A1c 5.6% today.  Doing extremely well with diet and exercise interventions for diabetes control.  Continue current lifestyle modifications.  Continue atorvastatin.  Diabetic foot exam completed today. - POCT glycosylated hemoglobin (Hb A1C)  2. Essential hypertension Blood pressure stable, home readings at goal.  Continue lisinopril-hydrochlorothiazide 20-12.5 mg daily as prescribed.  3. Flu vaccine need Flu vaccine given in office today. - Flu Vaccine QUAD 41mo+IM (Fluarix, Fluzone & Alfiuria Quad  PF)  Return in about 6 months (around 04/08/2022) for DM/HTN/HLD follow up. ___________________________________________ Thayer Ohm, DNP, APRN, FNP-BC Primary Care and Sports Medicine [facility]

## 2021-11-12 ENCOUNTER — Other Ambulatory Visit: Payer: Self-pay | Admitting: Medical-Surgical

## 2021-11-12 DIAGNOSIS — I10 Essential (primary) hypertension: Secondary | ICD-10-CM

## 2022-01-21 ENCOUNTER — Other Ambulatory Visit: Payer: Self-pay | Admitting: Medical-Surgical

## 2022-01-21 DIAGNOSIS — I10 Essential (primary) hypertension: Secondary | ICD-10-CM

## 2022-01-24 ENCOUNTER — Other Ambulatory Visit: Payer: Self-pay

## 2022-01-24 DIAGNOSIS — I10 Essential (primary) hypertension: Secondary | ICD-10-CM

## 2022-01-24 MED ORDER — LISINOPRIL-HYDROCHLOROTHIAZIDE 20-12.5 MG PO TABS: 1.0000 | ORAL_TABLET | Freq: Every day | ORAL | 0 refills | Status: DC

## 2022-01-30 IMAGING — CR DG CHEST 2V
2 series · 2 of 2 positions shown · non-contrast
Comparison: May 15, 2020

CLINICAL DATA: Chest pain

EXAM:
CHEST - 2 VIEW

[w chest pa]
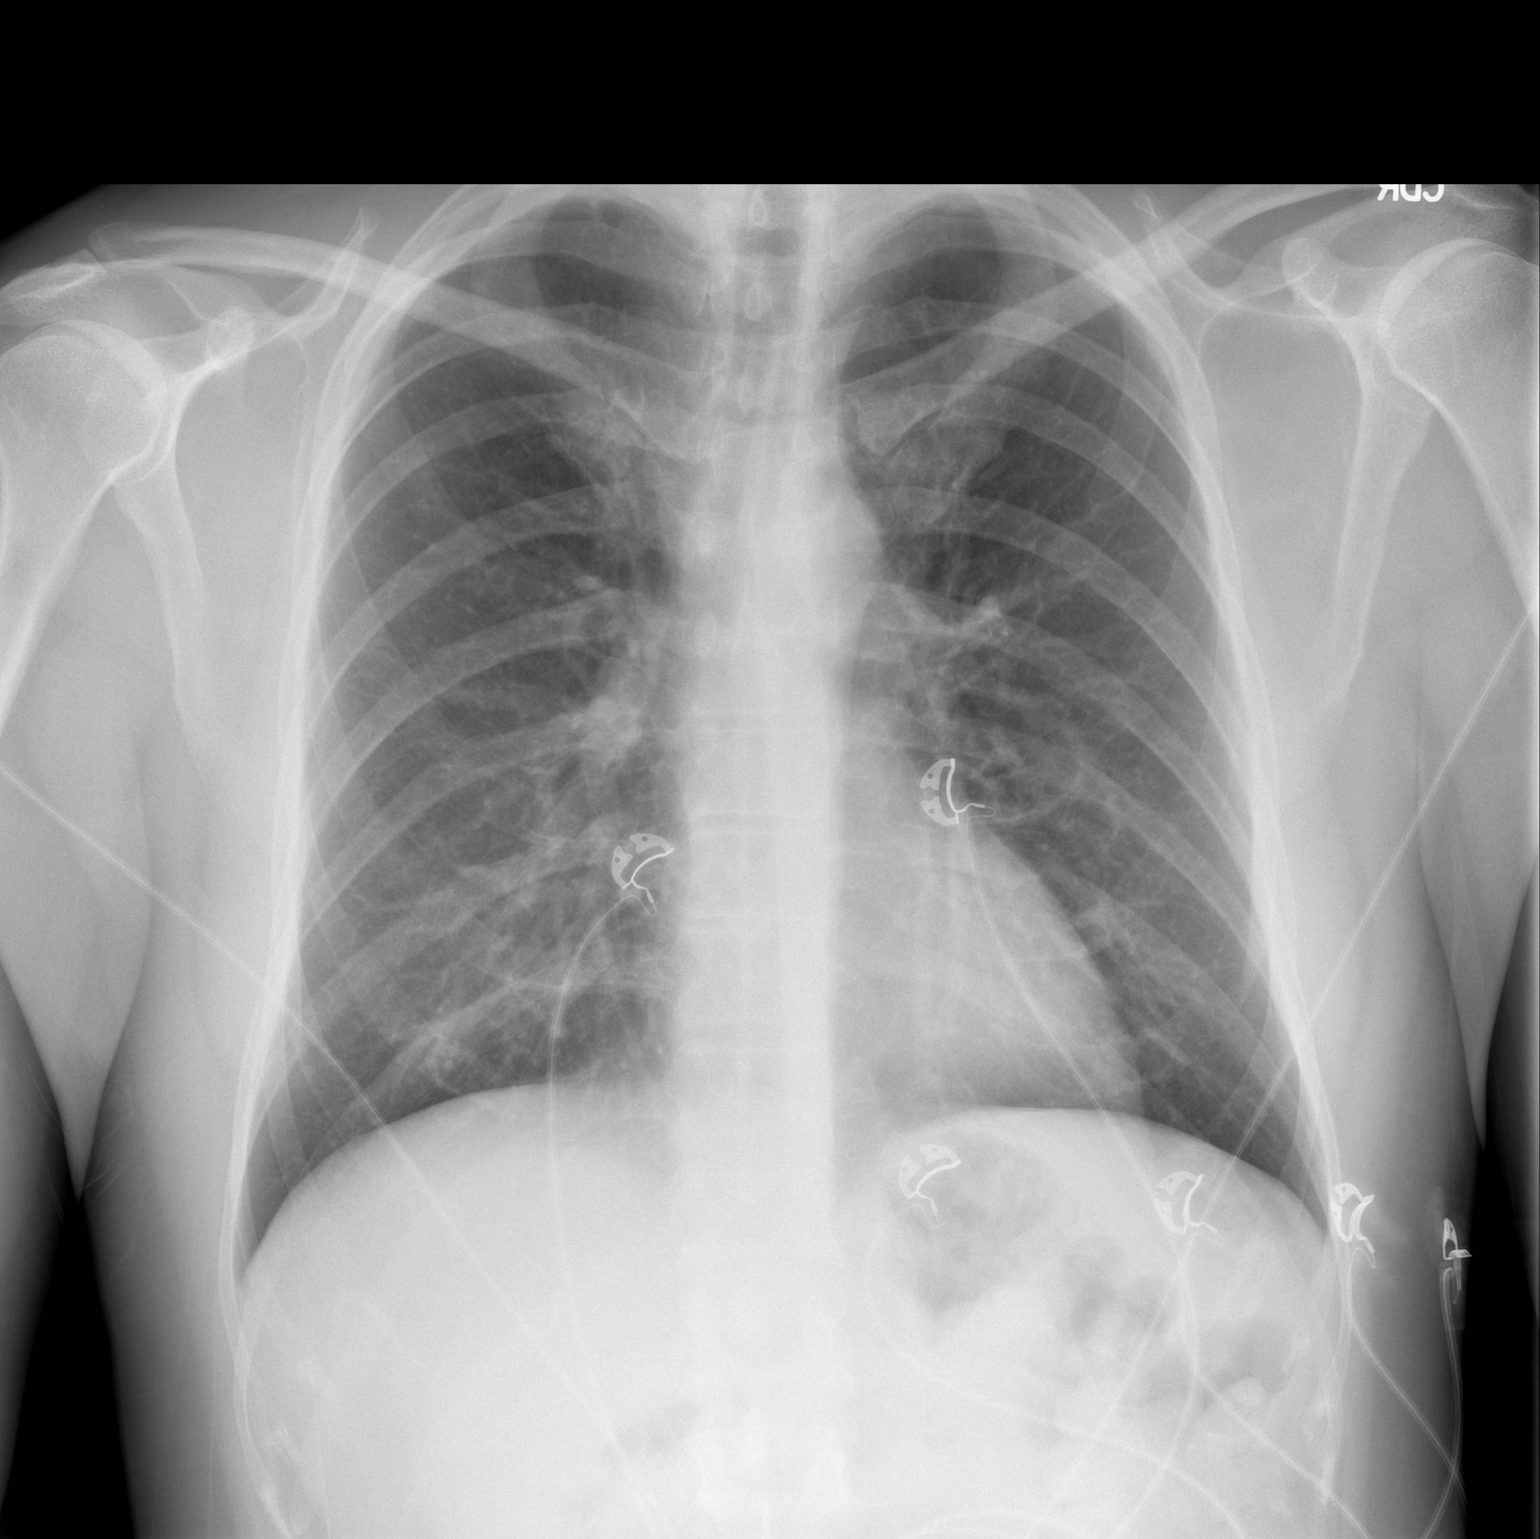

[w chest lat]
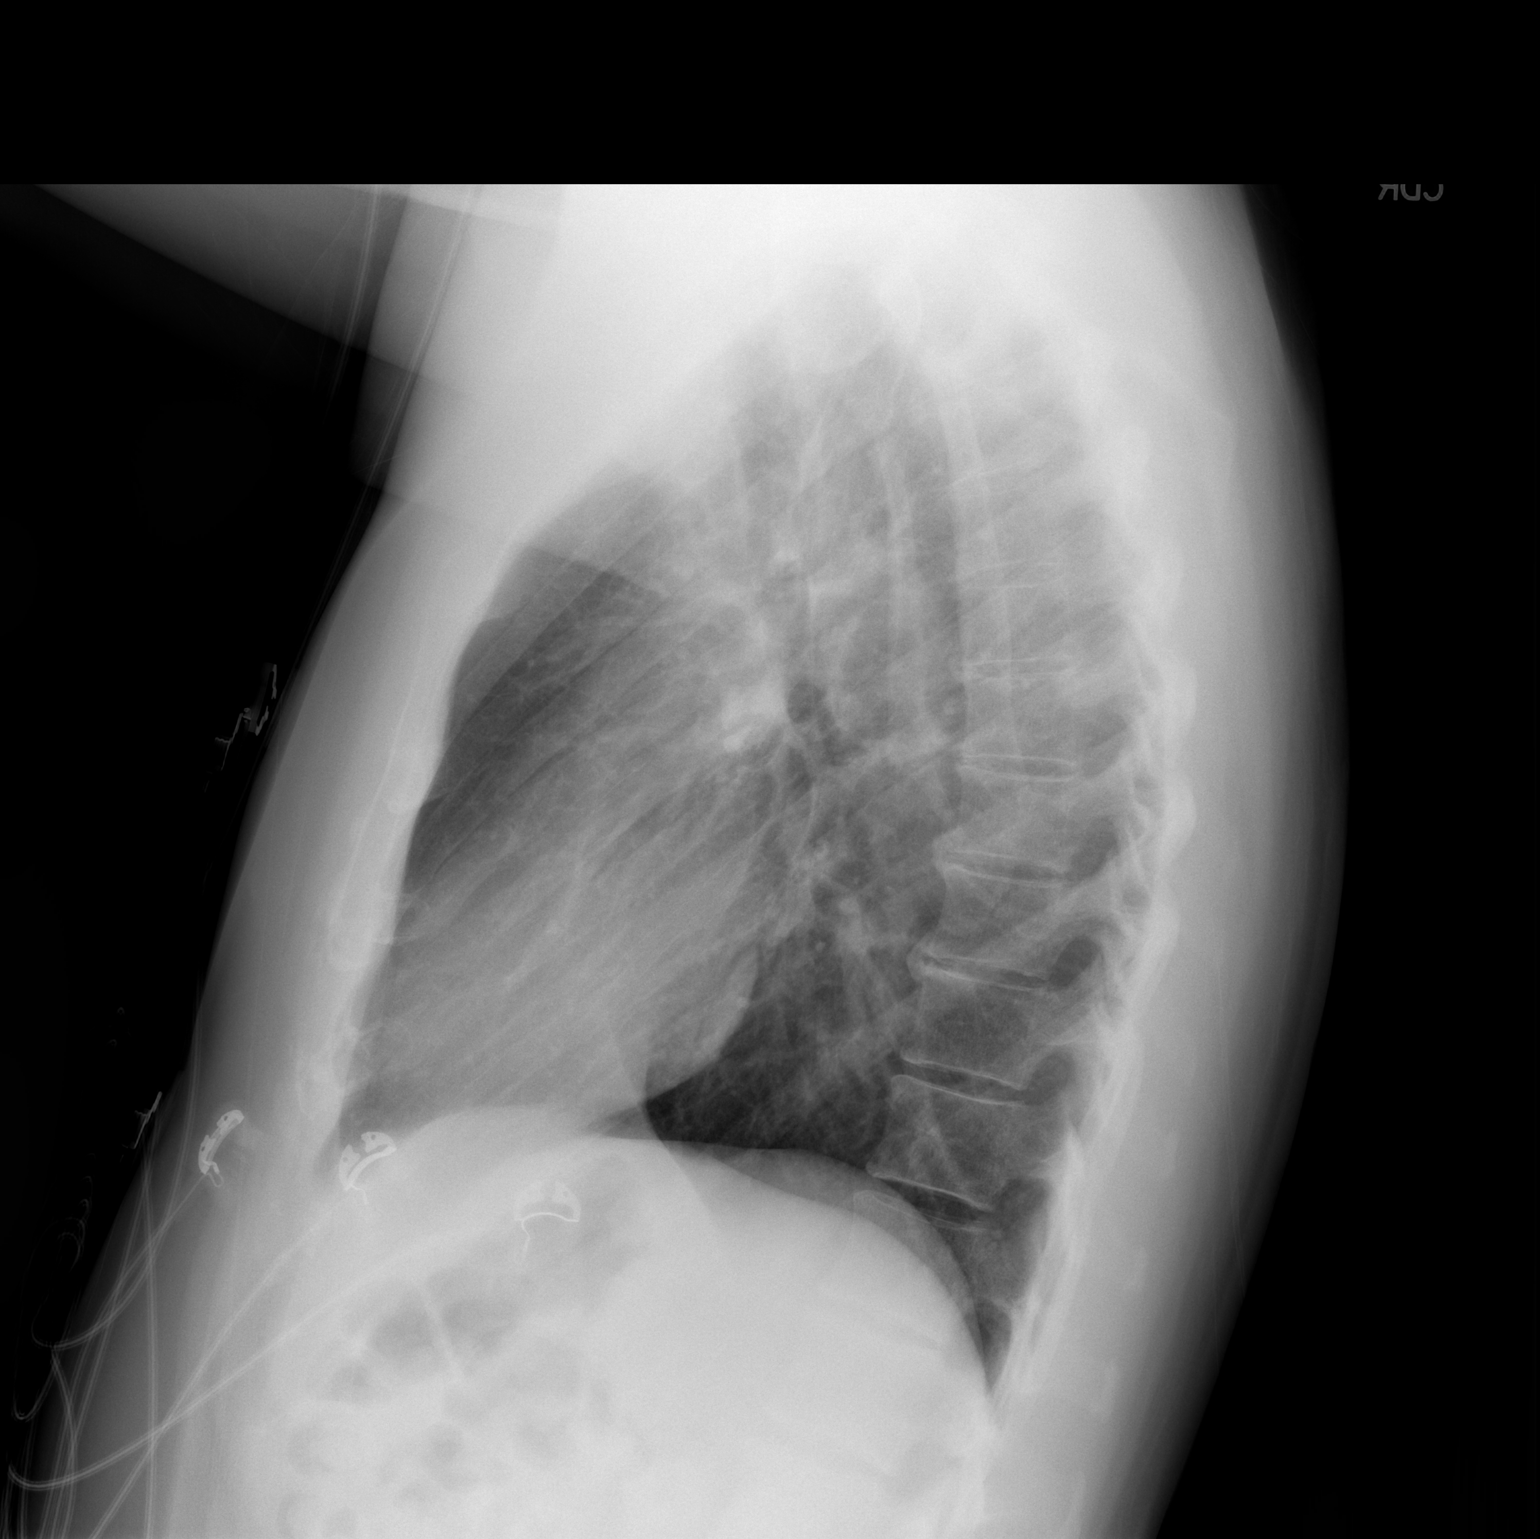

[2 of 2 positions shown; findings below may reference images not displayed]

FINDINGS: The heart size and mediastinal contours are within normal limits.
Both lungs are clear. The visualized skeletal structures are
unremarkable.
IMPRESSION: No active cardiopulmonary disease.

## 2022-02-19 ENCOUNTER — Other Ambulatory Visit: Payer: Self-pay | Admitting: Medical-Surgical

## 2022-02-19 DIAGNOSIS — E119 Type 2 diabetes mellitus without complications: Secondary | ICD-10-CM

## 2022-02-19 MED ORDER — ATORVASTATIN CALCIUM 20 MG PO TABS: ORAL_TABLET | ORAL | 0 refills | Status: DC

## 2022-03-20 LAB — HM DIABETES EYE EXAM

## 2022-04-07 NOTE — Progress Notes (Signed)
?  HPI with pertinent ROS:  ? ?CC: chronic disease follow up ? ?HPI: ?Pleasant 46 year old male presenting today for follow up on: ? ?HTN-taking lisinopril-hydrochlorothiazide 20-12.5 mg daily, tolerating well without side effects.  Checking blood pressures at home approximately 2 times weekly with readings in the 130s/70s-80s.  He has restarted his walking exercise program and goes about 4 miles per day if the weather is warm enough.  He is also getting into the busy time for his job so will be far more active.  Admits that he has gained a few pounds back but usually loses it when his job gets very busy.  Continues to eat healthy and avoids adding salt.  Denies CP, SOB, palpitations, lower extremity edema, dizziness, headaches, or vision changes. ? ?GERD-working with a GI provider with [facility] and they are planning for colonoscopy, endoscopy, and continued management of her reflux symptoms. ? ?DM-checking sugars intermittently.  Most recent postprandial reading 145 and recheck later 102.  Currently diet and lifestyle modifications but no medication. ? ?Hyperlipidemia-taking atorvastatin 20 mg daily, tolerating well without side effects.  Following a low-fat diet. ? ?I reviewed the past medical history, family history, social history, surgical history, and allergies today and no changes were needed.  Please see the problem list section below in epic for further details. ? ? ?Physical exam:  ? ?General: Well Developed, well nourished, and in no acute distress.  ?Neuro: Alert and oriented x3.  ?HEENT: Normocephalic, atraumatic.  ?Skin: Warm and dry. ?Cardiac: Regular rate and rhythm, no murmurs rubs or gallops, no lower extremity edema.  ?Respiratory: Clear to auscultation bilaterally. Not using accessory muscles, speaking in full sentences. ? ?Impression and Recommendations:   ? ?1. Essential hypertension ?Checking labs.  Blood pressure slightly elevated in office but he admits that he has been extremely busy  today.  Continue lisinopril-hydrochlorothiazide 20-12.5 mg daily.  Check blood pressures at home with goal of 130/80 or less.  If consistently higher, return for further evaluation and adjustment of medications.  Recommend continued exercise and low-sodium diet.  Recommend weight loss to healthy weight. ?- CBC with Differential/Platelet ?- COMPLETE METABOLIC PANEL WITH GFR ?- Lipid panel ?- lisinopril-hydrochlorothiazide (ZESTORETIC) 20-12.5 MG tablet; Take 1 tablet by mouth daily. For blood pressure  Dispense: 90 tablet; Refill: 3 ? ?2. Gastroesophageal reflux disease without esophagitis ?Managed by [facility]. ? ?3. Diabetes mellitus without complication ([place]) ?Checking labs.  POCT hemoglobin A1c 6.0%.  Continue diet and lifestyle modifications for management. ?- COMPLETE METABOLIC PANEL WITH GFR ?- Lipid panel ?- atorvastatin (LIPITOR) 20 MG tablet; Take 1 tablet (20 mg) by mouth daily.  Dispense: 90 tablet; Refill: 3 ? ?4. Abnormal thyroid blood test ?Checking TSH and free T4. ?- TSH + free T4 ? ?5. Hyperlipidemia associated with type 2 diabetes mellitus ([place]) ?Checking lipid panel today.  Continue atorvastatin as prescribed. ?- Lipid panel ? ?Return in about 6 months (around 10/08/2022) for DM/HTN/HLD follow up. ?___________________________________________ ?Clearnce Sorrel, DNP, APRN, FNP-BC ?Primary Care and Sports Medicine ?[place] ?

## 2022-04-08 ENCOUNTER — Ambulatory Visit: Payer: 59 | Admitting: Medical-Surgical

## 2022-04-08 ENCOUNTER — Encounter: Payer: Self-pay | Admitting: Medical-Surgical

## 2022-04-08 VITALS — BP 149/90 | HR 82 | Resp 20 | Ht 66.75 in | Wt 190.6 lb

## 2022-04-08 DIAGNOSIS — E119 Type 2 diabetes mellitus without complications: Secondary | ICD-10-CM | POA: Diagnosis not present

## 2022-04-08 DIAGNOSIS — R7989 Other specified abnormal findings of blood chemistry: Secondary | ICD-10-CM

## 2022-04-08 DIAGNOSIS — I10 Essential (primary) hypertension: Secondary | ICD-10-CM | POA: Diagnosis not present

## 2022-04-08 DIAGNOSIS — E785 Hyperlipidemia, unspecified: Secondary | ICD-10-CM

## 2022-04-08 DIAGNOSIS — K219 Gastro-esophageal reflux disease without esophagitis: Secondary | ICD-10-CM

## 2022-04-08 DIAGNOSIS — E1169 Type 2 diabetes mellitus with other specified complication: Secondary | ICD-10-CM

## 2022-04-08 LAB — POCT GLYCOSYLATED HEMOGLOBIN (HGB A1C): HbA1c, POC (controlled diabetic range): 6 % (ref 0.0–7.0)

## 2022-04-08 LAB — POCT UA - MICROALBUMIN
Albumin/Creatinine Ratio, Urine, POC: <30
Creatinine, POC: 10 mg/dL
Microalbumin Ur, POC: 10 mg/L

## 2022-04-08 MED ORDER — ATORVASTATIN CALCIUM 20 MG PO TABS: ORAL_TABLET | ORAL | 3 refills | Status: DC

## 2022-04-08 MED ORDER — LISINOPRIL-HYDROCHLOROTHIAZIDE 20-12.5 MG PO TABS: 1.0000 | ORAL_TABLET | Freq: Every day | ORAL | 3 refills | Status: DC

## 2022-04-09 LAB — CBC WITH DIFFERENTIAL/PLATELET
Absolute Monocytes: 439 cells/uL (ref 200–950)
Basophils Absolute: 51 cells/uL (ref 0–200)
Basophils Relative: 0.9 %
Eosinophils Absolute: 439 cells/uL (ref 15–500)
Eosinophils Relative: 7.7 %
HCT: 45.6 % (ref 38.5–50.0)
Hemoglobin: 15.8 g/dL (ref 13.2–17.1)
Lymphs Abs: 1499 cells/uL (ref 850–3900)
MCH: 30 pg (ref 27.0–33.0)
MCHC: 34.6 g/dL (ref 32.0–36.0)
MCV: 86.7 fL (ref 80.0–100.0)
MPV: 10.9 fL (ref 7.5–12.5)
Monocytes Relative: 7.7 %
Neutro Abs: 3272 cells/uL (ref 1500–7800)
Neutrophils Relative %: 57.4 %
Platelets: 225 10*3/uL (ref 140–400)
RBC: 5.26 10*6/uL (ref 4.20–5.80)
RDW: 13.1 % (ref 11.0–15.0)
Total Lymphocyte: 26.3 %
WBC: 5.7 10*3/uL (ref 3.8–10.8)

## 2022-04-09 LAB — COMPLETE METABOLIC PANEL WITH GFR
AG Ratio: 1.6 (calc) (ref 1.0–2.5)
ALT: 27 U/L (ref 9–46)
AST: 20 U/L (ref 10–40)
Albumin: 4.7 g/dL (ref 3.6–5.1)
Alkaline phosphatase (APISO): 51 U/L (ref 36–130)
BUN: 17 mg/dL (ref 7–25)
CO2: 29 mmol/L (ref 20–32)
Calcium: 10.3 mg/dL (ref 8.6–10.3)
Chloride: 99 mmol/L (ref 98–110)
Creat: 1.11 mg/dL (ref 0.60–1.29)
Globulin: 3 g/dL (calc) (ref 1.9–3.7)
Glucose, Bld: 96 mg/dL (ref 65–99)
Potassium: 4.5 mmol/L (ref 3.5–5.3)
Sodium: 140 mmol/L (ref 135–146)
Total Bilirubin: 0.8 mg/dL (ref 0.2–1.2)
Total Protein: 7.7 g/dL (ref 6.1–8.1)
eGFR: 83 mL/min/{1.73_m2} (ref >=60)

## 2022-04-09 LAB — LIPID PANEL
Cholesterol: 127 mg/dL (ref <200)
HDL: 38 mg/dL — ABNORMAL LOW (ref >=40)
LDL Cholesterol (Calc): 65 mg/dL (calc)
Non-HDL Cholesterol (Calc): 89 mg/dL (calc) (ref <130)
Total CHOL/HDL Ratio: 3.3 (calc) (ref <5.0)
Triglycerides: 153 mg/dL — ABNORMAL HIGH (ref <150)

## 2022-04-09 LAB — TSH+FREE T4: TSH W/REFLEX TO FT4: 3.09 mIU/L (ref 0.40–4.50)

## 2022-09-04 ENCOUNTER — Encounter (HOSPITAL_COMMUNITY)
Admission: EM | Disposition: A | Discharge status: Home / Self Care | Payer: Self-pay | Source: Home / Self Care | Attending: Emergency Medicine

## 2022-09-04 ENCOUNTER — Ambulatory Visit
Admission: EM | Admit: 2022-09-04 | Discharge: 2022-09-04 | Disposition: A | Discharge status: Home / Self Care | Payer: 59 | Attending: Family Medicine | Admitting: Family Medicine

## 2022-09-04 ENCOUNTER — Encounter (HOSPITAL_BASED_OUTPATIENT_CLINIC_OR_DEPARTMENT_OTHER): Payer: Self-pay | Admitting: Emergency Medicine

## 2022-09-04 ENCOUNTER — Emergency Department (HOSPITAL_BASED_OUTPATIENT_CLINIC_OR_DEPARTMENT_OTHER): Payer: 59

## 2022-09-04 ENCOUNTER — Ambulatory Visit (HOSPITAL_BASED_OUTPATIENT_CLINIC_OR_DEPARTMENT_OTHER)
Admission: EM | Admit: 2022-09-04 | Discharge: 2022-09-04 | Disposition: A | Discharge status: Home / Self Care | Payer: 59 | Attending: Emergency Medicine | Admitting: Emergency Medicine

## 2022-09-04 ENCOUNTER — Other Ambulatory Visit: Payer: Self-pay

## 2022-09-04 ENCOUNTER — Emergency Department (HOSPITAL_COMMUNITY): Payer: 59 | Admitting: Certified Registered"

## 2022-09-04 ENCOUNTER — Emergency Department (EMERGENCY_DEPARTMENT_HOSPITAL): Payer: 59 | Admitting: Certified Registered"

## 2022-09-04 DIAGNOSIS — J392 Other diseases of pharynx: Secondary | ICD-10-CM | POA: Insufficient documentation

## 2022-09-04 DIAGNOSIS — R1310 Dysphagia, unspecified: Secondary | ICD-10-CM | POA: Insufficient documentation

## 2022-09-04 DIAGNOSIS — R0989 Other specified symptoms and signs involving the circulatory and respiratory systems: Secondary | ICD-10-CM

## 2022-09-04 DIAGNOSIS — K224 Dyskinesia of esophagus: Secondary | ICD-10-CM

## 2022-09-04 DIAGNOSIS — E119 Type 2 diabetes mellitus without complications: Secondary | ICD-10-CM | POA: Diagnosis not present

## 2022-09-04 DIAGNOSIS — K219 Gastro-esophageal reflux disease without esophagitis: Secondary | ICD-10-CM | POA: Diagnosis not present

## 2022-09-04 DIAGNOSIS — K222 Esophageal obstruction: Secondary | ICD-10-CM | POA: Insufficient documentation

## 2022-09-04 DIAGNOSIS — Z79899 Other long term (current) drug therapy: Secondary | ICD-10-CM | POA: Diagnosis not present

## 2022-09-04 DIAGNOSIS — I10 Essential (primary) hypertension: Secondary | ICD-10-CM | POA: Diagnosis not present

## 2022-09-04 HISTORY — PX: BIOPSY: SHX5522

## 2022-09-04 HISTORY — PX: ESOPHAGOGASTRODUODENOSCOPY: SHX5428

## 2022-09-04 LAB — GLUCOSE, CAPILLARY: Glucose-Capillary: 103 mg/dL — ABNORMAL HIGH (ref 70–99)

## 2022-09-04 SURGERY — EGD (ESOPHAGOGASTRODUODENOSCOPY)
Anesthesia: General

## 2022-09-04 MED ORDER — METOCLOPRAMIDE HCL 5 MG/ML IJ SOLN
5.0000 mg | Freq: Once | INTRAMUSCULAR | Status: AC
  Administered 2022-09-04: 5 mg via INTRAMUSCULAR

## 2022-09-04 MED ORDER — ESMOLOL HCL 100 MG/10ML IV SOLN
INTRAVENOUS | Status: DC | PRN
  Administered 2022-09-04: 40 mg via INTRAVENOUS

## 2022-09-04 MED ORDER — ONDANSETRON HCL 4 MG/2ML IJ SOLN
INTRAMUSCULAR | Status: DC | PRN
  Administered 2022-09-04: 4 mg via INTRAVENOUS

## 2022-09-04 MED ORDER — DEXAMETHASONE SODIUM PHOSPHATE 10 MG/ML IJ SOLN
INTRAMUSCULAR | Status: DC | PRN
  Administered 2022-09-04: 8 mg via INTRAVENOUS

## 2022-09-04 MED ORDER — GLUCAGON HCL RDNA (DIAGNOSTIC) 1 MG IJ SOLR
1.0000 mg | Freq: Once | INTRAMUSCULAR | Status: AC
  Administered 2022-09-04: 1 mg via INTRAMUSCULAR
  Filled 2022-09-04: qty 1

## 2022-09-04 MED ORDER — PROPOFOL 10 MG/ML IV BOLUS
INTRAVENOUS | Status: AC
  Filled 2022-09-04: qty 20

## 2022-09-04 MED ORDER — PROPOFOL 10 MG/ML IV BOLUS
INTRAVENOUS | Status: DC | PRN
  Administered 2022-09-04: 30 mg via INTRAVENOUS
  Administered 2022-09-04: 170 mg via INTRAVENOUS

## 2022-09-04 MED ORDER — LIDOCAINE VISCOUS HCL 2 % MT SOLN
15.0000 mL | Freq: Once | OROMUCOSAL | Status: AC
  Administered 2022-09-04: 15 mL via ORAL

## 2022-09-04 MED ORDER — LIDOCAINE 2% (20 MG/ML) 5 ML SYRINGE
INTRAMUSCULAR | Status: DC | PRN
  Administered 2022-09-04: 100 mg via INTRAVENOUS

## 2022-09-04 MED ORDER — SUCCINYLCHOLINE CHLORIDE 200 MG/10ML IV SOSY
PREFILLED_SYRINGE | INTRAVENOUS | Status: DC | PRN
  Administered 2022-09-04: 100 mg via INTRAVENOUS

## 2022-09-04 MED ORDER — LACTATED RINGERS IV SOLN: INTRAVENOUS | Status: DC | PRN

## 2022-09-04 MED ORDER — ALUM & MAG HYDROXIDE-SIMETH 200-200-20 MG/5ML PO SUSP
30.0000 mL | Freq: Once | ORAL | Status: AC
  Administered 2022-09-04: 30 mL via ORAL

## 2022-09-04 MED ORDER — FENTANYL CITRATE (PF) 100 MCG/2ML IJ SOLN
INTRAMUSCULAR | Status: AC
  Filled 2022-09-04: qty 2

## 2022-09-04 NOTE — ED Notes (Signed)
Patient is being discharged from the Urgent Care and sent to the Emergency Department via POV. Per Dr Cathren Harsh, patient is in need of higher level of care due to esophageal spasm. Patient is aware and verbalizes understanding of plan of care.  Vitals:   09/04/22 1347  BP: 137/85  Pulse: 77  Resp: 17  SpO2: 99%

## 2022-09-04 NOTE — Transfer of Care (Signed)
Immediate Anesthesia Transfer of Care Note  Patient: Robert Glover  Procedure(s) Performed: ESOPHAGOGASTRODUODENOSCOPY (EGD) BIOPSY  Patient Location: PACU  Anesthesia Type:General  Level of Consciousness: awake, alert , oriented and patient cooperative  Airway & Oxygen Therapy: Patient Spontanous Breathing and Patient connected to face mask oxygen  Post-op Assessment: Report given to RN and Post -op Vital signs reviewed and stable  Post vital signs: Reviewed and stable  Last Vitals:  Vitals Value Taken Time  BP 144/108 09/04/22 2252  Temp 36.4 C 09/04/22 2252  Pulse 119 09/04/22 2258  Resp 14 09/04/22 2258  SpO2 98 % 09/04/22 2258  Vitals shown include unvalidated device data.  Last Pain:  Vitals:   09/04/22 2257  TempSrc:   PainSc: 0-No pain         Complications: No notable events documented.

## 2022-09-04 NOTE — Discharge Instructions (Signed)
Go to [facility] Emergency Department immediately

## 2022-09-04 NOTE — ED Provider Notes (Signed)
Assumed care from Delice Bison, PA-C at shift change pending GI consultation. See his note for full HPI.  7:17 PM Spoke to Dr. Dulce Sellar with GI who recommends I discuss with patient's primary GI doctor at [facility] given he is followed by their practice for similar symptoms.    7:49 PM Spoke with Dr. Gery Pray with [facility] who recommends discussing with Dr. Dulce Sellar again given patient was seen over 1 year ago and is not followed  by their practice closely.   8:16 PM Spoke to Dr. Dulce Sellar with GI who is going to speak to Dr. Gery Pray to decide disposition of patient.   8:32 PM Spoke to Dr. Dulce Sellar who spoke to Dr. Gery Pray. Dr. Dulce Sellar recommend ED to ED transfer to [facility]. Consult Dr. Dulce Sellar when patient arrives to ED.  Patient will be transferred ED to ED by POV. Dr. Criss Alvine accepts. Denies any difficulties breathing.  Airway patent prior to transfer. Advised patient to go straight to [employer].  No eating or drinking prior to arrival.      [name] 09/04/22 2051    Glyn Ade, MD 09/04/22 [phone]

## 2022-09-04 NOTE — ED Notes (Signed)
Pt transferred to [facility] via POV

## 2022-09-04 NOTE — Discharge Instructions (Addendum)
Endoscopy Care After Please read the instructions outlined below and refer to this sheet in the next few weeks. These discharge instructions provide you with general information on caring for yourself after you leave the hospital. Your doctor may also give you specific instructions. While your treatment has been planned according to the most current medical practices available, unavoidable complications occasionally occur. If you have any problems or questions after discharge, please call Dr. Dulce Sellar [facility]) at [phone].  HOME CARE INSTRUCTIONS Activity You may resume your regular activity but move at a slower pace for the next 24 hours.  Take frequent rest periods for the next 24 hours.  Walking will help expel (get rid of) the air and reduce the bloated feeling in your abdomen.  No driving for 24 hours (because of the anesthesia (medicine) used during the test).  You may shower.  Do not sign any important legal documents or operate any machinery for 24 hours (because of the anesthesia used during the test).  Nutrition Drink plenty of fluids.  Liquid or finely pureed diet until further notice.  Do NOT eat chunks of meat (chicken, steak, pork, fish, etc) or chunks of bread or chunks of vegetables/fruits.  You have a narrowing of your esophagus which will likely need to be stretched in the future. Avoid alcoholic beverages for 24 hours or as instructed by your caregiver.  Medications You may resume your normal medications unless your caregiver tells you otherwise. What you can expect today You may experience abdominal discomfort such as a feeling of fullness or "gas" pains.  You may experience a sore throat for 2 to 3 days. This is normal. Gargling with salt water may help this.   SEEK IMMEDIATE MEDICAL CARE IF: You have excessive nausea (feeling sick to your stomach) and/or vomiting.  You have severe abdominal pain and distention (swelling).  You have trouble swallowing.   You have a temperature over 100 F (37.8 C).  You have rectal bleeding or vomiting of blood.  Document Released: 07/30/2004 Document Revised: 08/28/2011 Document Reviewed: 02/10/2008 [facility] Patient Information 2012 [place], [place].

## 2022-09-04 NOTE — Op Note (Signed)
[facility] Patient Name: Robert Glover Procedure Date: 09/04/2022 MRN: [id number] Attending MD: Arta Silence , MD Date of Birth: 05/09/1976 CSN: [id number] Age: 46 Admit Type: Outpatient Procedure:                Upper GI endoscopy Indications:              Dysphagia, Foreign body in the esophagus Providers:                Arta Silence, MD, Doristine Johns, RN, Darliss Cheney, Technician Referring MD:              Medicines:                General Anesthesia Complications:            No immediate complications. Estimated Blood Loss:     Estimated blood loss: none. Procedure:                Pre-Anesthesia Assessment:                           - Prior to the procedure, a History and Physical                            was performed, and patient medications and                            allergies were reviewed. The patient's tolerance of                            previous anesthesia was also reviewed. The risks                            and benefits of the procedure and the sedation                            options and risks were discussed with the patient.                            All questions were answered, and informed consent                            was obtained. Prior Anticoagulants: The patient has                            taken no previous anticoagulant or antiplatelet                            agents. ASA Grade Assessment: II - A patient with                            mild systemic disease. After reviewing the risks  and benefits, the patient was deemed in                            satisfactory condition to undergo the procedure.                           After obtaining informed consent, the endoscope was                            passed under direct vision. Throughout the                            procedure, the patient's blood pressure, pulse, and                            oxygen  saturations were monitored continuously. The                            [id number]) Olympus endoscope was introduced                            through the mouth, and advanced to the lower third                            of esophagus. The upper GI endoscopy was                            accomplished without difficulty. The patient                            tolerated the procedure well. Scope In: Scope Out: Findings:      One benign-appearing, intrinsic severe (stenosis; an endoscope cannot       pass) stenosis was found 43 cm from the incisors, what seems to be at or       near the GE junction. This stenosis measured 5 mm (inner diameter). I       was unable to traverse this stricture both with diagnostic and with       pediatric (5.4 mm diameter) endoscope. Biopsies were taken with a cold       forceps for histology of stricture and esophagus.      The posterior oropharynx is erythematous and edematous; few small (<1cm)       leaves of salad, likely remnants of prior impaction, but no ongoing food       impaction, passing the endoscope through both sides of arytenoid folds. Impression:               - Significantly narrowed but benign-appearing                            esophageal stenosis at/near GE junction. Biopsied                            to assess for eosinophilic esophagitis.                           -  The posterior oropharynx is erythematous and                            edematous. No esophageal food impaction seen at                            this time.                           - Unable to examine stomach, pylorus, duodenum, due                            to very narrow GE junction stricture. Moderate Sedation:      None Recommendation:           - Patient has a contact number available for                            emergencies. The signs and symptoms of potential                            delayed complications were discussed with the                             patient. Return to normal activities tomorrow.                            Written discharge instructions were provided to the                            patient.                           - Discharge patient to home (via wheelchair).                           - Very finely pureed/liquid diet until further                            notice.                           - Continue present medications. Needs to stay on                            PPI.                           - Await pathology results.                           - Patient will need follow-up of significantly                            narrowed distal esophageal stricture. Very  surprised he hasn't had dysphagia until today. If                            EOE, would try medical therapy, but suspect he will                            ultimately need endoscopy with dilatation (which                            would require fluoroscopy and very slow incremental                            dilatation of his stricture of ~ 44mm in diameter).                           - Patient to return to GI clinic in [place]                            for ongoing care, at patient's request. Procedure Code(s):        --- Professional ---                           [phone], Esophagoscopy, flexible, transoral; with                            biopsy, single or multiple Diagnosis Code(s):        --- Professional ---                           K22.2, Esophageal obstruction                           J39.2, Other diseases of pharynx                           R13.10, Dysphagia, unspecified                           T18.108A, Unspecified foreign body in esophagus                            causing other injury, initial encounter CPT copyright 2019 [employer]. All rights reserved. The codes documented in this report are preliminary and upon coder review may  be revised to meet current compliance  requirements. Willis Modena, MD 09/04/2022 11:00:41 PM This report has been signed electronically. Number of Addenda: 0

## 2022-09-04 NOTE — Anesthesia Procedure Notes (Signed)
Procedure Name: Intubation Date/Time: 09/04/2022 10:28 PM  Performed by: Cleda Daub, CRNAPre-anesthesia Checklist: Patient identified, Emergency Drugs available, Suction available and Patient being monitored Patient Re-evaluated:Patient Re-evaluated prior to induction Oxygen Delivery Method: Circle system utilized Preoxygenation: Pre-oxygenation with 100% oxygen Induction Type: IV induction, Cricoid Pressure applied and Rapid sequence Ventilation: Mask ventilation without difficulty Laryngoscope Size: Mac and 4 Grade View: Grade I Tube type: Oral Tube size: 7.5 mm Number of attempts: 1 Airway Equipment and Method: Stylet and Oral airway Placement Confirmation: ETT inserted through vocal cords under direct vision, positive ETCO2 and breath sounds checked- equal and bilateral Secured at: 23 cm Tube secured with: Tape Dental Injury: Teeth and Oropharynx as per pre-operative assessment

## 2022-09-04 NOTE — ED Triage Notes (Addendum)
Pt c/o difficulty swallowing. Problem is intermittent but usually goes away. Usually lasts an hour, this has been over an hour. Hx of acid reflux. Gas X with no relief.

## 2022-09-04 NOTE — ED Provider Notes (Signed)
[facility] EMERGENCY DEPARTMENT Provider Note   CSN: [id number] Arrival date & time: 09/04/22  1659     History  Chief Complaint  Patient presents with   Globus    Robert Glover is a 46 y.o. male with medical history of acid reflux, esophageal dilation 10 years ago.  The patient presents to ED for evaluation of globus sensation.  Patient reports that earlier today he was eating salad at lunchtime when halfway through his meal he began to feel a sensation of pressure in his throat.  The patient states that this feeling of pressure has progressively gotten worse throughout the day.  The patient states he feels as if he is unable to swallow.  The patient states that he has a history of acid reflux currently being treated with Prilosec.  The patient states that he does have a remote history of esophageal dilation 10 years ago.  Patient states that whenever he ingests liquid he is unable to fully swallow the liquid.  The patient states he must spit the liquid back out as he is unable to swallow.  The patient was originally seen in urgent care prior to arrival to the ED.  At urgent care the patient was treated with GI cocktail, viscous lidocaine and Reglan.  The patient reports that none of these medications alleviated this global sensation in his throat. The patient is endorsing difficulty swallowing, drooling, nausea, vomiting. Patient denies shortness of breath.   HPI     Home Medications Prior to Admission medications   Medication Sig Start Date End Date Taking? Authorizing Provider  atorvastatin (LIPITOR) 20 MG tablet Take 1 tablet (20 mg) by mouth daily. 04/08/22  Yes Christen Butter, NP  lisinopril-hydrochlorothiazide (ZESTORETIC) 20-12.5 MG tablet Take 1 tablet by mouth daily. For blood pressure 04/08/22  Yes Jessup, Joy, NP  pantoprazole (PROTONIX) 20 MG tablet Take 20 mg by mouth as needed.    [provider]      Allergies    Latex    Review of Systems    Review of Systems  HENT:  Positive for drooling and trouble swallowing.   Respiratory:  Negative for shortness of breath.   Gastrointestinal:  Positive for nausea and vomiting.  All other systems reviewed and are negative.   Physical Exam Updated Vital Signs BP (!) 142/81 (BP Location: Left Arm)   Pulse 93   Temp 98.4 F (36.9 C) (Oral)   Resp 20   Ht 5' 6.75" (1.695 m)   Wt 87 kg   SpO2 98%   BMI 30.27 kg/m  Physical Exam Vitals and nursing note reviewed.  Constitutional:      General: He is not in acute distress.    Appearance: Normal appearance. He is not ill-appearing, toxic-appearing or diaphoretic.  HENT:     Head: Normocephalic and atraumatic.     Nose: Nose normal. No congestion.     Mouth/Throat:     Mouth: Mucous membranes are moist.     Pharynx: Oropharynx is clear.  Eyes:     Extraocular Movements: Extraocular movements intact.     Conjunctiva/sclera: Conjunctivae normal.     Pupils: Pupils are equal, round, and reactive to light.  Cardiovascular:     Rate and Rhythm: Normal rate and regular rhythm.  Pulmonary:     Effort: Pulmonary effort is normal.     Breath sounds: Normal breath sounds. No wheezing.  Abdominal:     General: Abdomen is flat.     Palpations:  Abdomen is soft.     Tenderness: There is no abdominal tenderness.  Musculoskeletal:     Cervical back: Normal range of motion and neck supple. No tenderness.  Skin:    General: Skin is warm and dry.     Capillary Refill: Capillary refill takes less than 2 seconds.  Neurological:     Mental Status: He is alert and oriented to person, place, and time.     ED Results / Procedures / Treatments   Labs (all labs ordered are listed, but only abnormal results are displayed) Labs Reviewed - No data to display  EKG None  Radiology DG Neck Soft Tissue  Result Date: 09/04/2022 CLINICAL DATA:  Pt states he has the feeling that food is stuck in his throat that started after eating lunch today.  Intermittent reflux. Reports difficulty swallowing water. Airway patent. Pt maintaining saliva and speaking in full sentences. NAD. Pt sent here from UC. Pt states he has had this problem for many years just getting worse. EXAM: NECK SOFT TISSUES - 1+ VIEW COMPARISON:  None Available. FINDINGS: There is no evidence of retropharyngeal soft tissue swelling or epiglottic enlargement. The cervical airway is unremarkable and no radio-opaque foreign body identified. IMPRESSION: Negative. Electronically Signed   By: Amie Portland M.D.   On: 09/04/2022 18:04    Procedures Procedures   Medications Ordered in ED Medications  glucagon (human recombinant) (GLUCAGEN) injection 1 mg (1 mg Intramuscular Given 09/04/22 1759)    ED Course/ Medical Decision Making/ A&P Clinical Course as of 09/04/22 2151  Wed Sep 04, 2022  1830 Stable 45 YOM with globus sensation. Feels like he cant swallow but is tolerating secretions. Amb ref GI. PO challenge [CC]    Clinical Course User Index [CC] Glyn Ade, MD                           Medical Decision Making Amount and/or Complexity of Data Reviewed Radiology: ordered.  Risk Prescription drug management.   45YOM presents to ED for evaluation. Please see HPI for further details.  On examination patient is handling secretions appropriately. He is afebrile, nontachycardic. Lung sounds CTAB, nonhypoxic on RA. Patient abdomen soft and compressible throughout. Nontoxic in appearance. Denies shortness of breath, difficulty breathing.  Patient upright plain film imaging shows no appreciable foreign body. Patient treated with glucagon, states he feels slight relief after administered. PO fluid challenge attempted unsuccessfully. GI consult placed.  At end of shift GI consult was pending, patient signed out to [facility] for further disposition.    Final Clinical Impression(s) / ED Diagnoses Final diagnoses:  Globus sensation    Rx / DC Orders ED  Discharge Orders     None         Clent Ridges 09/04/22 2151    Glyn Ade, MD 09/04/22 [phone]

## 2022-09-04 NOTE — ED Triage Notes (Signed)
Pt states he has the feeling that food is stuck in his throat that started after eating lunch today. Intermittent reflux. Reports difficulty swallowing water. Airway patent. Pt maintaining saliva and speaking in full sentences. NAD. Pt sent here from UC.

## 2022-09-04 NOTE — Anesthesia Preprocedure Evaluation (Addendum)
Anesthesia Evaluation  Patient identified by MRN, date of birth, ID band Patient awake    Reviewed: Allergy & Precautions, NPO status , Patient's Chart, lab work & pertinent test results  Airway Mallampati: II  TM Distance: >3 FB Neck ROM: Full    Dental  (+) Teeth Intact, Dental Advisory Given, Chipped,    Pulmonary neg pulmonary ROS,    Pulmonary exam normal breath sounds clear to auscultation       Cardiovascular hypertension, Pt. on medications Normal cardiovascular exam Rhythm:Regular Rate:Normal     Neuro/Psych negative neurological ROS     GI/Hepatic Neg liver ROS, GERD  Medicated,Food bolus   Endo/Other  diabetes, Type 2  Renal/GU negative Renal ROS     Musculoskeletal negative musculoskeletal ROS (+)   Abdominal   Peds  Hematology negative hematology ROS (+)   Anesthesia Other Findings Day of surgery medications reviewed with the patient.  Reproductive/Obstetrics                            Anesthesia Physical Anesthesia Plan  ASA: 2 and emergent  Anesthesia Plan: General   Post-op Pain Management: Minimal or no pain anticipated   Induction: Intravenous, Cricoid pressure planned and Rapid sequence  PONV Risk Score and Plan: 2 and Dexamethasone and Ondansetron  Airway Management Planned: Oral ETT  Additional Equipment:   Intra-op Plan:   Post-operative Plan: Extubation in OR  Informed Consent: I have reviewed the patients History and Physical, chart, labs and discussed the procedure including the risks, benefits and alternatives for the proposed anesthesia with the patient or authorized representative who has indicated his/her understanding and acceptance.     Dental advisory given  Plan Discussed with: CRNA  Anesthesia Plan Comments:         Anesthesia Quick Evaluation

## 2022-09-04 NOTE — Consult Note (Signed)
[facility] Gastroenterology Consultation Note  Referring Provider: No ref. provider found Primary Care Physician:  Christen Butter, NP Primary Gastroenterologist:  Dr. Quinn Axe ([facility])  Reason for Consultation:  suspected esophageal food impaction  HPI: Robert Glover is a 46 y.o. male here for possible esophageal food impaction.  Has GERD.  After eating salad today at lunch, has foreign body sensation upper chest and throat, no sialorrhea, but not able to tolerate liquids.  Couple prior endoscopies, last about 10 years ago.  No dysphagia leading up to this event for 10+ years.   Past Medical History:  Diagnosis Date   COVID-19    Diabetes mellitus without complication (HCC)    Pre-hypertension    Reflux     Past Surgical History:  Procedure Laterality Date   NO PAST SURGERIES      Prior to Admission medications   Medication Sig Start Date End Date Taking? Authorizing Provider  atorvastatin (LIPITOR) 20 MG tablet Take 1 tablet (20 mg) by mouth daily. 04/08/22  Yes Christen Butter, NP  lisinopril-hydrochlorothiazide (ZESTORETIC) 20-12.5 MG tablet Take 1 tablet by mouth daily. For blood pressure 04/08/22  Yes Jessup, Joy, NP  pantoprazole (PROTONIX) 20 MG tablet Take 20 mg by mouth as needed.    [provider]    No current facility-administered medications for this encounter.    Allergies as of 09/04/2022 - Review Complete 09/04/2022  Allergen Reaction Noted   Latex Rash 01/27/2014    Family History  Problem Relation Age of Onset   Hypertension Mother    Hypertension Father    Parkinsonism Father    Cancer Neg Hx    COPD Neg Hx    Depression Neg Hx    Hyperlipidemia Neg Hx    Kidney disease Neg Hx    Stroke Neg Hx     Social History   Socioeconomic History   Marital status: Single    Spouse name: Not on file   Number of children: Not on file   Years of education: Not on file   Highest education level: Not on file  Occupational History   Occupation:  Automotive engineer  Tobacco Use   Smoking status: Never   Smokeless tobacco: Never  Vaping Use   Vaping Use: Never used  Substance and Sexual Activity   Alcohol use: Yes    Comment: Rarely   Drug use: No   Sexual activity: Not on file  Other Topics Concern   Not on file  Social History Narrative   Not on file   Social Determinants of Health   Financial Resource Strain: Not on file  Food Insecurity: Not on file  Transportation Needs: Not on file  Physical Activity: Not on file  Stress: Not on file  Social Connections: Not on file  Intimate Partner Violence: Not on file    Review of Systems: As per HPI, all others negative  Physical Exam: Vital signs in last 24 hours: Temp:  [98 F (36.7 C)-98.4 F (36.9 C)] 98 F (36.7 C) (09/06 2155) Pulse Rate:  [77-110] 110 (09/06 2155) Resp:  [16-20] 16 (09/06 2155) BP: (137-177)/(80-98) 177/98 (09/06 2155) SpO2:  [97 %-99 %] 97 % (09/06 2155) Weight:  [83.9 kg-87 kg] 83.9 kg (09/06 2155)   General:   Alert,  Well-developed, well-nourished, pleasant and cooperative in NAD Head:  Normocephalic and atraumatic. Eyes:  Sclera clear, no icterus.   Conjunctiva pink. Ears:  Normal auditory acuity. Nose:  No deformity, discharge,  or lesions. Mouth:  No deformity or lesions.  Oropharynx pink & moist. Neck:  Supple; no masses or thyromegaly. Heart:  Regular rate Abdomen:  Soft, nontender and nondistended. No masses, hepatosplenomegaly or hernias noted. Normal bowel sounds, without guarding, and without rebound.     Msk:  Symmetrical without gross deformities. Normal posture. Pulses:  Normal pulses noted. Extremities:  Without clubbing or edema. Neurologic:  Alert and  oriented x4;  grossly normal neurologically. Skin:  Intact without significant lesions or rashes. Psych:  Alert and cooperative. Normal mood and affect.   Lab Results: No results for input(s): "WBC", "HGB", "HCT", "PLT" in the last 72 hours. BMET No results for  input(s): "NA", "K", "CL", "CO2", "GLUCOSE", "BUN", "CREATININE", "CALCIUM" in the last 72 hours. LFT No results for input(s): "PROT", "ALBUMIN", "AST", "ALT", "ALKPHOS", "BILITOT", "BILIDIR", "IBILI" in the last 72 hours. PT/INR No results for input(s): "LABPROT", "INR" in the last 72 hours.  Studies/Results: DG Neck Soft Tissue  Result Date: 09/04/2022 CLINICAL DATA:  Pt states he has the feeling that food is stuck in his throat that started after eating lunch today. Intermittent reflux. Reports difficulty swallowing water. Airway patent. Pt maintaining saliva and speaking in full sentences. NAD. Pt sent here from UC. Pt states he has had this problem for many years just getting worse. EXAM: NECK SOFT TISSUES - 1+ VIEW COMPARISON:  None Available. FINDINGS: There is no evidence of retropharyngeal soft tissue swelling or epiglottic enlargement. The cervical airway is unremarkable and no radio-opaque foreign body identified. IMPRESSION: Negative. Electronically Signed   By: Amie Portland M.D.   On: 09/04/2022 18:04    Impression:   Suspected esophageal  food impaction. GERD.  Plan:   Endoscopy today with possible esophageal foreign body removal. Risks (bleeding, infection, bowel perforation that could require surgery, sedation-related changes in cardiopulmonary systems), benefits (identification and possible treatment of source of symptoms, exclusion of certain causes of symptoms), and alternatives (watchful waiting, radiographic imaging studies, empiric medical treatment) of upper endoscopy (EGD) were explained to patient/family in detail and patient wishes to proceed.    LOS: 0 days   Mayci Haning M  09/04/2022, 10:26 PM  Cell [phone] If no answer or after 5 PM call [phone]

## 2022-09-04 NOTE — ED Notes (Signed)
Report given to charge nurse at [facility] ED

## 2022-09-04 NOTE — ED Provider Notes (Signed)
[facility]    CSN: [id number] Arrival date & time: 09/04/22  1336      History   Chief Complaint Chief Complaint  Patient presents with   Dysphagia    HPI Robert Glover is a 46 y.o. male.   After eating lunch today patient developed a globus sensation in his throat, reporting that he became unable to swallow anything, including liquids, with a constant sensation of pressure in his throat.  Anytime he tries to swallow, he regurgitates reporting drooling and nausea (but not vomiting).  He has had occasional similar occurrences in the past that resolve after drinking some fluid.  He tried Gas-X without improvement.  He has a past history of GERD, having had esophageal dilation in the distant past.  He is currently taking Prilosec.  He denies abdominal pain and shortness of breath.  The history is provided by the patient.    Past Medical History:  Diagnosis Date   COVID-19    Diabetes mellitus without complication (HCC)    Pre-hypertension    Reflux     Patient Active Problem List   Diagnosis Date Noted   Discomfort of groin 07/01/2021   Diabetes mellitus without complication (HCC) 01/21/2020   BACK PAIN, LUMBAR 05/29/2010   Essential hypertension 11/20/2009   Gastroesophageal reflux disease without esophagitis 11/20/2009   CHEST PAIN UNSPECIFIED 11/20/2009    Past Surgical History:  Procedure Laterality Date   NO PAST SURGERIES         Home Medications    Prior to Admission medications   Medication Sig Start Date End Date Taking? Authorizing Provider  pantoprazole (PROTONIX) 20 MG tablet Take 20 mg by mouth as needed.   Yes [provider]  atorvastatin (LIPITOR) 20 MG tablet Take 1 tablet (20 mg) by mouth daily. 04/08/22   Christen Butter, NP  lisinopril-hydrochlorothiazide (ZESTORETIC) 20-12.5 MG tablet Take 1 tablet by mouth daily. For blood pressure 04/08/22   Christen Butter, NP    Family History Family History  Problem Relation Age of  Onset   Hypertension Mother    Hypertension Father    Parkinsonism Father    Cancer Neg Hx    COPD Neg Hx    Depression Neg Hx    Hyperlipidemia Neg Hx    Kidney disease Neg Hx    Stroke Neg Hx     Social History Social History   Tobacco Use   Smoking status: Never   Smokeless tobacco: Never  Vaping Use   Vaping Use: Never used  Substance Use Topics   Alcohol use: Yes    Comment: Rarely   Drug use: No     Allergies   Latex   Review of Systems Review of Systems  Constitutional:  Positive for appetite change. Negative for activity change, chills, diaphoresis, fatigue and fever.  HENT:  Positive for trouble swallowing.   Eyes: Negative.   Respiratory: Negative.    Cardiovascular: Negative.   Gastrointestinal:  Positive for nausea. Negative for abdominal distention, abdominal pain and vomiting.  Genitourinary: Negative.   Musculoskeletal: Negative.   Skin: Negative.   Neurological: Negative.      Physical Exam Triage Vital Signs ED Triage Vitals  Enc Vitals Group     BP 09/04/22 1347 137/85     Pulse Rate 09/04/22 1347 77     Resp 09/04/22 1347 17     Temp --      Temp Source 09/04/22 1347 Oral     SpO2 09/04/22 1347  99 %     Weight --      Height --      Head Circumference --      Peak Flow --      Pain Score 09/04/22 1348 0     Pain Loc --      Pain Edu? --      Excl. in [place]? --    No data found.  Updated Vital Signs BP 137/85 (BP Location: Left Arm)   Pulse 77   Resp 17   SpO2 99%   Visual Acuity Right Eye Distance:   Left Eye Distance:   Bilateral Distance:    Right Eye Near:   Left Eye Near:    Bilateral Near:     Physical Exam Nursing notes and Vital Signs reviewed. Appearance:  Patient appears uncomfortable but in no acute distress.  He is alert and oriented.  He is intermittently regurgitating in the exam room. Eyes:  Pupils are equal, round, and reactive to light and accomodation.  Extraocular movement is intact.  Nose:   Normal. Mouth/Pharynx:  Normal; moist mucous membranes. Neck:  Supple.  No adenopathy. Lungs:  Clear to auscultation.   Moving air well. Heart:  Regular rate and rhythm   Abdomen:  Nontender  Extremities:  No edema.  Skin:  No rash present.   UC Treatments / Results  Labs (all labs ordered are listed, but only abnormal results are displayed) Labs Reviewed - No data to display  EKG   Radiology No results found.  Procedures Procedures (including critical care time)  Medications Ordered in UC Medications  alum & mag hydroxide-simeth (MAALOX/MYLANTA) 200-200-20 MG/5ML suspension 30 mL (30 mLs Oral Given 09/04/22 1545)    And  lidocaine (XYLOCAINE) 2 % viscous mouth solution 15 mL (15 mLs Oral Given 09/04/22 1545)  metoCLOPramide (REGLAN) injection 5 mg (5 mg Intramuscular Given 09/04/22 1609)    Initial Impression / Assessment and Plan / UC Course  I have reviewed the triage vital signs and the nursing notes.  Pertinent labs & imaging results that were available during my care of the patient were reviewed by me and considered in my medical decision making (see chart for details).    Patient had no response to GI cocktail of Mylanta/viscous lidocaine, and Reglan 5mg  IM. Advised to proceed immediately to ED for further evaluation.  Final Clinical Impressions(s) / UC Diagnoses   Final diagnoses:  Esophageal spasm     Discharge Instructions      Go to [facility] Emergency Department immediately    ED Prescriptions   None       [facility], MD 09/06/22 1312

## 2022-09-04 NOTE — ED Notes (Signed)
Pt given fluids for PO challenge. Pt reports liquids came back up and since has started with hiccups

## 2022-09-05 ENCOUNTER — Telehealth: Payer: Self-pay | Admitting: Emergency Medicine

## 2022-09-05 NOTE — Telephone Encounter (Signed)
Spoke with patient states that he had an emergency endoscopy last night and it was found that is esophagus is constricted down to the size of a straw.  Patient is scheduled to see his GI doc tomorrow for another endoscopy and hopefully to fix the problem .

## 2022-09-05 NOTE — Anesthesia Postprocedure Evaluation (Signed)
Anesthesia Post Note  Patient: Robert Glover  Procedure(s) Performed: ESOPHAGOGASTRODUODENOSCOPY (EGD) BIOPSY     Patient location during evaluation: PACU Anesthesia Type: General Level of consciousness: awake and alert Pain management: pain level controlled Vital Signs Assessment: post-procedure vital signs reviewed and stable Respiratory status: spontaneous breathing, nonlabored ventilation, respiratory function stable and patient connected to nasal cannula oxygen Cardiovascular status: blood pressure returned to baseline, stable and tachycardic Postop Assessment: no apparent nausea or vomiting Anesthetic complications: no   No notable events documented.  Last Vitals:  Vitals:   09/04/22 2315 09/04/22 2335  BP: (!) 153/90 (!) 155/89  Pulse: (!) 109 (!) 110  Resp: (!) 23 20  Temp:  36.8 C  SpO2: 98% 99%    Last Pain:  Vitals:   09/04/22 2335  TempSrc:   PainSc: 0-No pain                 Collene Schlichter

## 2022-09-06 ENCOUNTER — Encounter (HOSPITAL_COMMUNITY): Payer: Self-pay | Admitting: Gastroenterology

## 2022-09-06 LAB — SURGICAL PATHOLOGY

## 2022-10-11 ENCOUNTER — Ambulatory Visit: Payer: 59 | Admitting: Medical-Surgical

## 2022-11-19 ENCOUNTER — Ambulatory Visit: Payer: 59 | Admitting: Medical-Surgical

## 2022-11-19 DIAGNOSIS — E119 Type 2 diabetes mellitus without complications: Secondary | ICD-10-CM

## 2023-01-21 ENCOUNTER — Encounter: Payer: Self-pay | Admitting: Medical-Surgical

## 2023-01-21 ENCOUNTER — Ambulatory Visit: Payer: 59 | Admitting: Medical-Surgical

## 2023-01-21 VITALS — BP 136/80 | HR 90 | Resp 20 | Ht 68.0 in | Wt 202.8 lb

## 2023-01-21 DIAGNOSIS — E119 Type 2 diabetes mellitus without complications: Secondary | ICD-10-CM | POA: Diagnosis not present

## 2023-01-21 DIAGNOSIS — F4321 Adjustment disorder with depressed mood: Secondary | ICD-10-CM | POA: Diagnosis not present

## 2023-01-21 DIAGNOSIS — I10 Essential (primary) hypertension: Secondary | ICD-10-CM

## 2023-01-21 MED ORDER — LISINOPRIL-HYDROCHLOROTHIAZIDE 20-12.5 MG PO TABS: 1.0000 | ORAL_TABLET | Freq: Every day | ORAL | 3 refills | Status: DC

## 2023-01-21 MED ORDER — ATORVASTATIN CALCIUM 20 MG PO TABS: ORAL_TABLET | ORAL | 3 refills | Status: DC

## 2023-01-21 NOTE — Progress Notes (Signed)
Established Patient Office Visit  Subjective   Patient ID: Robert Glover, male   DOB: 07-01-1976 Age: 47 y.o. MRN: [id number]   Chief Complaint  Patient presents with   Follow-up   Hypertension   Diabetes   HPI Pleasant 47 year old male presenting today for the following:  Mood: Notes that the end of last year was really rough for him.  His mother was diagnosed with colon cancer in May of last year and her health deteriorated rapidly.  On 31-Oct-2023, she passed away after spending a month in the hospital.  He was by her bedside for the entire month and admits that his self-care habits were difficult to maintain.  Since she has passed away, he has had a lot of difficulty with grief.  Currently doing grief counseling provided by hospice.  He feels that his sadness and grief is triggered at this time since he is responsible for managing her estate.  He has taken measures to obtain an attorney to help with this process and is reaching out for help from his friends rather than bearing the burden by himself.  Denies SI/HI.  Hypertension: Taking lisinopril-hydrochlorothiazide 20-12.5 mg daily, tolerating well without side effects.  Admits that his previous exercise habits suffered with his mother's illness however he got back on the wagon 3 weeks ago.  Has resumed a low-sodium diet and is working to get his blood pressures back to normal.  Has been checking at home and notes that over the last week, he has seen readings in the 120s/70s.  Denies CP, SOB, palpitations, lower extremity edema, dizziness, headaches, or vision changes.  Diabetes: Currently diet and exercise controlled.  Notes that his diet has had significant room for improvement but for the last 3 weeks, he has been doing much better.  Has had some weight gain and ended up eating what ever was on hand for a while there.  Knows that his A1c will likely be a little higher than it was previously.   Objective:    Vitals:   01/21/23  1442  BP: 136/80  Pulse: 90  Resp: 20  Height: 5\' 8"  (1.727 m)  Weight: 202 lb 12.8 oz (92 kg)  SpO2: 100%  BMI (Calculated): 30.84    Physical Exam Vitals reviewed.  Constitutional:      General: He is not in acute distress.    Appearance: Normal appearance. He is obese. He is not ill-appearing.  HENT:     Head: Normocephalic.  Cardiovascular:     Rate and Rhythm: Normal rate.     Pulses: Normal pulses.     Heart sounds: Normal heart sounds. No murmur heard.    No friction rub. No gallop.  Pulmonary:     Effort: Pulmonary effort is normal. No respiratory distress.     Breath sounds: Normal breath sounds.  Skin:    General: Skin is warm and dry.  Neurological:     Mental Status: He is alert and oriented to person, place, and time.  Psychiatric:        Mood and Affect: Mood normal. Affect is tearful.        Behavior: Behavior normal.        Thought Content: Thought content normal.        Judgment: Judgment normal.   No results found for this or any previous visit (from the past 24 hour(s)).     The ASCVD Risk score (Arnett DK, et al., 2019) failed to calculate for the  following reasons:   The valid total cholesterol range is 130 to 320 mg/dL   Assessment & Plan:   1. Diabetes mellitus without complication ([place]) Checking labs as below.  Plan to check his hemoglobin A1c.  Previously very well-controlled with diet and exercise habits.  Would like to avoid adding medication if he is doing well getting back on track. - COMPLETE METABOLIC PANEL WITH GFR - Lipid Panel w/reflex Direct LDL - Hemoglobin A1c - atorvastatin (LIPITOR) 20 MG tablet; Take 1 tablet (20 mg) by mouth daily.  Dispense: 90 tablet; Refill: 3  2. Essential hypertension Checking labs as below.  Blood pressure at goal.  Continue lisinopril-hydrochlorothiazide 20-12.5 mg daily.  Low-sodium diet, regular intentional exercise and weight loss to a healthy weight recommended. - CBC with  Differential/Platelet - COMPLETE METABOLIC PANEL WITH GFR - Lipid Panel w/reflex Direct LDL - lisinopril-hydrochlorothiazide (ZESTORETIC) 20-12.5 MG tablet; Take 1 tablet by mouth daily. For blood pressure  Dispense: 90 tablet; Refill: 3  3. Grief Continue grief counseling.  Discussed the benefit of the medication but he would like to avoid this for now.  He will let me know if he changes his mind.  Return in about 6 months (around 07/22/2023) for DM/HTN/HLD follow up.  ___________________________________________ Clearnce Sorrel, DNP, APRN, FNP-BC [facility] [place]

## 2023-01-22 LAB — CBC WITH DIFFERENTIAL/PLATELET
Absolute Monocytes: 428 cells/uL (ref 200–950)
Basophils Absolute: 81 cells/uL (ref 0–200)
Basophils Relative: 1.3 %
Eosinophils Absolute: 651 cells/uL — ABNORMAL HIGH (ref 15–500)
Eosinophils Relative: 10.5 %
HCT: 45.8 % (ref 38.5–50.0)
Hemoglobin: 16.3 g/dL (ref 13.2–17.1)
Lymphs Abs: 1724 cells/uL (ref 850–3900)
MCH: 30.5 pg (ref 27.0–33.0)
MCHC: 35.6 g/dL (ref 32.0–36.0)
MCV: 85.6 fL (ref 80.0–100.0)
MPV: 10.7 fL (ref 7.5–12.5)
Monocytes Relative: 6.9 %
Neutro Abs: 3317 cells/uL (ref 1500–7800)
Neutrophils Relative %: 53.5 %
Platelets: 253 10*3/uL (ref 140–400)
RBC: 5.35 10*6/uL (ref 4.20–5.80)
RDW: 13.5 % (ref 11.0–15.0)
Total Lymphocyte: 27.8 %
WBC: 6.2 10*3/uL (ref 3.8–10.8)

## 2023-01-22 LAB — LIPID PANEL W/REFLEX DIRECT LDL
Cholesterol: 148 mg/dL (ref <200)
HDL: 50 mg/dL (ref >=40)
LDL Cholesterol (Calc): 75 mg/dL (calc)
Non-HDL Cholesterol (Calc): 98 mg/dL (calc) (ref <130)
Total CHOL/HDL Ratio: 3 (calc) (ref <5.0)
Triglycerides: 147 mg/dL (ref <150)

## 2023-01-22 LAB — COMPLETE METABOLIC PANEL WITH GFR
AG Ratio: 1.8 (calc) (ref 1.0–2.5)
ALT: 34 U/L (ref 9–46)
AST: 19 U/L (ref 10–40)
Albumin: 4.8 g/dL (ref 3.6–5.1)
Alkaline phosphatase (APISO): 57 U/L (ref 36–130)
BUN: 16 mg/dL (ref 7–25)
CO2: 32 mmol/L (ref 20–32)
Calcium: 9.9 mg/dL (ref 8.6–10.3)
Chloride: 100 mmol/L (ref 98–110)
Creat: 1.12 mg/dL (ref 0.60–1.29)
Globulin: 2.6 g/dL (calc) (ref 1.9–3.7)
Glucose, Bld: 121 mg/dL — ABNORMAL HIGH (ref 65–99)
Potassium: 4.1 mmol/L (ref 3.5–5.3)
Sodium: 140 mmol/L (ref 135–146)
Total Bilirubin: 0.7 mg/dL (ref 0.2–1.2)
Total Protein: 7.4 g/dL (ref 6.1–8.1)
eGFR: 82 mL/min/{1.73_m2} (ref >=60)

## 2023-01-22 LAB — HEMOGLOBIN A1C
Hgb A1c MFr Bld: 7.2 % of total Hgb — ABNORMAL HIGH (ref <5.7)
Mean Plasma Glucose: 160 mg/dL
eAG (mmol/L): 8.9 mmol/L

## 2023-03-12 ENCOUNTER — Ambulatory Visit
Admission: EM | Admit: 2023-03-12 | Discharge: 2023-03-12 | Disposition: A | Discharge status: Home / Self Care | Payer: 59 | Attending: Emergency Medicine | Admitting: Emergency Medicine

## 2023-03-12 ENCOUNTER — Other Ambulatory Visit: Payer: Self-pay

## 2023-03-12 ENCOUNTER — Encounter: Payer: Self-pay | Admitting: Emergency Medicine

## 2023-03-12 DIAGNOSIS — R1906 Epigastric swelling, mass or lump: Secondary | ICD-10-CM | POA: Diagnosis not present

## 2023-03-12 LAB — POCT FASTING CBG KUC MANUAL ENTRY: POCT Glucose (KUC): 194 mg/dL — AB (ref 70–99)

## 2023-03-12 MED ORDER — PANTOPRAZOLE SODIUM 40 MG PO PACK: 20.0000 mg | PACK | Freq: Every day | ORAL | 0 refills | Status: DC

## 2023-03-12 NOTE — ED Triage Notes (Signed)
Pt states on Monday he had an episode of feeling fatigue and "off", Tuesday he started having a fullness feeling in his chest that comes and goes. This am he started having nausea and lightheadedness. States history of GERD and DM.

## 2023-03-12 NOTE — ED Provider Notes (Addendum)
[facility]    CSN: [id number] Arrival date & time: 03/12/23  1320     History   Chief Complaint Chief Complaint  Patient presents with   Nausea    HPI Robert Glover is a 47 y.o. male.  Here with 3-day history of fullness sensation in the abdomen. At first he felt in the chest until it moved lower. Took antacid last night that helped. No symptoms overnight.  This morning had a little nausea and dizziness that resolved on its own No vomiting, diarrhea, constipation, abdominal pain, urinary symptoms, chest pain, shortness of breath  History of diabetes, reflux  Past Medical History:  Diagnosis Date   COVID-19    Diabetes mellitus without complication ([facility])    Pre-hypertension    Reflux     Patient Active Problem List   Diagnosis Date Noted   Discomfort of groin 07/01/2021   Hyperlipidemia 05/24/2021   Overweight with body mass index (BMI) of 29 to 29.9 in adult 05/24/2021   Non-smoker 05/23/2021   Diabetes mellitus without complication ([facility]) [id number]   BACK PAIN, LUMBAR 05/29/2010   Essential hypertension 11/20/2009   Gastroesophageal reflux disease without esophagitis 11/20/2009   CHEST PAIN UNSPECIFIED 11/20/2009    Past Surgical History:  Procedure Laterality Date   BIOPSY  09/04/2022   Procedure: BIOPSY;  Surgeon: Robert Silence, MD;  Location: [facility];  Service: Gastroenterology;;   ESOPHAGOGASTRODUODENOSCOPY N/A 09/04/2022   Procedure: ESOPHAGOGASTRODUODENOSCOPY (EGD);  Surgeon: Robert Silence, MD;  [facility]: [facility];  Service: Gastroenterology;  Laterality: N/A;   NO PAST SURGERIES         Home Medications    Prior to Admission medications   Medication Sig Start Date End Date Taking? Authorizing Provider  pantoprazole sodium (PROTONIX) 40 mg Take 20 mg by mouth daily for 14 days. 03/12/23 03/26/23 Yes Robert Gl[facility], Robert Guiles, PA-C  atorvastatin (LIPITOR) 20 MG tablet Take 1 tablet (20 mg) by mouth daily. 01/21/23   Robert Bouche, NP   lisinopril-hydrochlorothiazide (ZESTORETIC) 20-12.5 MG tablet Take 1 tablet by mouth daily. For blood pressure 01/21/23   Robert Bouche, NP    Family History Family History  Problem Relation Age of Onset   Hypertension Mother    Hypertension Father    Parkinsonism Father    Cancer Neg Hx    COPD Neg Hx    Depression Neg Hx    Hyperlipidemia Neg Hx    Kidney disease Neg Hx    Stroke Neg Hx     Social History Social History   Tobacco Use   Smoking status: Never   Smokeless tobacco: Never  Vaping Use   Vaping Use: Never used  Substance Use Topics   Alcohol use: Yes    Comment: Rarely   Drug use: No     Allergies   Latex   Review of Systems Review of Systems As per HPI  Physical Exam Triage Vital Signs ED Triage Vitals  Enc Vitals Group     BP 03/12/23 1336 (!) 165/102     Pulse Rate 03/12/23 1336 89     Resp 03/12/23 1336 16     Temp 03/12/23 1336 98 F (36.7 C)     Temp Source 03/12/23 1336 Oral     SpO2 03/12/23 1336 99 %     Weight --      Height --      Head Circumference --      Peak Flow --      Pain Score 03/12/23 1339  1     Pain Loc --      Pain Edu? --      Excl. in [place]? --    No data found.  Updated Vital Signs BP (!) 165/102 (BP Location: Right Arm)   Pulse 89   Temp 98 F (36.7 C) (Oral)   Resp 16   SpO2 99%   Physical Exam Vitals and nursing note reviewed.  Constitutional:      General: He is not in acute distress. HENT:     Nose: No congestion or rhinorrhea.     Mouth/Throat:     Mouth: Mucous membranes are moist.     Pharynx: Oropharynx is clear. No posterior oropharyngeal erythema.  Eyes:     Extraocular Movements: Extraocular movements intact.     Conjunctiva/sclera: Conjunctivae normal.     Pupils: Pupils are equal, round, and reactive to light.  Cardiovascular:     Rate and Rhythm: Normal rate and regular rhythm.     Heart sounds: Normal heart sounds.  Pulmonary:     Effort: Pulmonary effort is normal.     Breath  sounds: Normal breath sounds.  Abdominal:     General: There is no distension.     Palpations: There is no mass.     Tenderness: There is no abdominal tenderness. There is no guarding or rebound.     Comments: Some epigastric fullness, no pain  Musculoskeletal:        General: Normal range of motion.     Cervical back: Normal range of motion. No rigidity.  Skin:    General: Skin is warm and dry.  Neurological:     General: No focal deficit present.     Mental Status: He is alert and oriented to person, place, and time.      UC Treatments / Results  Labs (all labs ordered are listed, but only abnormal results are displayed) Labs Reviewed  POCT FASTING CBG [facility] MANUAL ENTRY - Abnormal; Notable for the following components:      Result Value   POCT Glucose ([facility]) 194 (*)    All other components within normal limits    EKG  Radiology No results found.  Procedures Procedures   Medications Ordered in UC Medications - No data to display  Initial Impression / Assessment and Plan / UC Course  I have reviewed the triage vital signs and the nursing notes.  Pertinent labs & imaging results that were available during my care of the patient were reviewed by me and considered in my medical decision making (see chart for details).  EKG normal sinus, ventricular rate of 84 bmp CBG 194 Well appearing, no acute distress. No red flags. Reports his BP is always elevated in clinic.   Consider reflux etiology given the epigastric fullness Start on Protonix once daily. Liquid per patient request Advised to call PCP and GI specialist for follow up Strict ED precautions  Final Clinical Impressions(s) / UC Diagnoses   Final diagnoses:  Epigastric fullness     Discharge Instructions      Please monitor blood sugars over the next week Increase water intake Take the Protonix once daily Please call primary care and schedule a follow up  Go to the emergency department if symptoms  worsen.     ED Prescriptions     Medication Sig Dispense Auth. Provider   pantoprazole sodium (PROTONIX) 40 mg Take 20 mg by mouth daily for 14 days. 14 each Robert Glover, Robert Guiles, PA-C  PDMP not reviewed this encounter.   Robert Glover, Robert Glover 03/12/23 1526    Robert Glover, Robert Glover, Robert Glover 03/12/23 1527

## 2023-03-12 NOTE — Discharge Instructions (Signed)
Please monitor blood sugars over the next week Increase water intake Take the Protonix once daily Please call primary care and schedule a follow up  Go to the emergency department if symptoms worsen.

## 2023-03-13 ENCOUNTER — Encounter: Payer: Self-pay | Admitting: Medical-Surgical

## 2023-03-13 ENCOUNTER — Telehealth: Payer: Self-pay | Admitting: Urgent Care

## 2023-03-13 DIAGNOSIS — I10 Essential (primary) hypertension: Secondary | ICD-10-CM

## 2023-03-13 DIAGNOSIS — E119 Type 2 diabetes mellitus without complications: Secondary | ICD-10-CM

## 2023-03-13 MED ORDER — ATORVASTATIN CALCIUM 20 MG PO TABS: ORAL_TABLET | ORAL | 3 refills | Status: DC

## 2023-03-13 MED ORDER — LISINOPRIL-HYDROCHLOROTHIAZIDE 20-12.5 MG PO TABS: 1.0000 | ORAL_TABLET | Freq: Every day | ORAL | 3 refills | Status: DC

## 2023-03-13 MED ORDER — PANTOPRAZOLE SODIUM 20 MG PO TBEC: 20.0000 mg | DELAYED_RELEASE_TABLET | Freq: Every day | ORAL | 0 refills | Status: DC

## 2023-03-13 NOTE — Telephone Encounter (Signed)
Pt called requesting his protonix to be sent to [facility] in pharmacy as [facility] not filling.

## 2023-03-24 LAB — HM DIABETES EYE EXAM

## 2023-04-02 ENCOUNTER — Telehealth: Payer: Self-pay | Admitting: General Practice

## 2023-04-02 NOTE — Transitions of Care (Post Inpatient/ED Visit) (Signed)
   04/02/2023  Name: Robert Glover MRN: [id number] DOB: 31-Aug-1976  Today's TOC FU Call Status: Today's TOC FU Call Status:: Unsuccessul Call (1st Attempt) Unsuccessful Call (1st Attempt) Date: 04/02/23  Attempted to reach the patient regarding the most recent Inpatient/ED visit.  Follow Up Plan: Additional outreach attempts will be made to reach the patient to complete the Transitions of Care (Post Inpatient/ED visit) call.   Signature Tinnie Gens, RN BSN

## 2023-04-07 NOTE — Transitions of Care (Post Inpatient/ED Visit) (Signed)
   04/07/2023  Name: Robert Glover MRN: [id number] DOB: Nov 06, 1976  Today's TOC FU Call Status: Today's TOC FU Call Status:: Unsuccessful Call (2nd Attempt) Unsuccessful Call (1st Attempt) Date: 04/02/23 Unsuccessful Call (2nd Attempt) Date: 04/07/23  Attempted to reach the patient regarding the most recent Inpatient/ED visit.  Follow Up Plan: Additional outreach attempts will be made to reach the patient to complete the Transitions of Care (Post Inpatient/ED visit) call.   Signature Modesto Charon, RN BSN

## 2023-04-08 NOTE — Transitions of Care (Post Inpatient/ED Visit) (Signed)
   04/08/2023  Name: Robert Glover MRN: [id number] DOB: 02/16/1976  Today's TOC FU Call Status: Today's TOC FU Call Status:: Unsuccessful Call (3rd Attempt) Unsuccessful Call (1st Attempt) Date: 04/02/23 Unsuccessful Call (2nd Attempt) Date: 04/07/23 Unsuccessful Call (3rd Attempt) Date: 04/08/23  Attempted to reach the patient regarding the most recent Inpatient/ED visit.  Follow Up Plan: No further outreach attempts will be made at this time. We have been unable to contact the patient.  Signature Modesto Charon, RN BSN

## 2023-04-16 ENCOUNTER — Telehealth: Payer: Self-pay | Admitting: General Practice

## 2023-04-16 NOTE — Transitions of Care (Post Inpatient/ED Visit) (Addendum)
   04/16/2023  Name: Robert Glover MRN: [id number] DOB: May 30, 1976  Today's TOC FU Call Status: Today's TOC FU Call Status:: Successful TOC FU Call Competed TOC FU Call Complete Date: 04/16/23  Transition Care Management Follow-up Telephone Call Date of Discharge: 04/15/23 Discharge Facility: Other ([facility]) Name of Other ([facility]) Discharge Facility: [facility] Type of Discharge: Emergency Department Reason for ED Visit: Cardiac Conditions Cardiac Conditions Diagnosis: Chest Pain Persisting How have you been since you were released from the hospital?: Same Any questions or concerns?: No  Items Reviewed: Did you receive and understand the discharge instructions provided?: No Medications obtained and verified?: Yes (Medications Reviewed) Any new allergies since your discharge?: No Dietary orders reviewed?: NA Do you have support at home?: Yes  Home Care and Equipment/Supplies: Were Home Health Services Ordered?: NA Any new equipment or medical supplies ordered?: NA  Functional Questionnaire: Do you need assistance with bathing/showering or dressing?: No Do you need assistance with meal preparation?: No Do you need assistance with eating?: No Do you have difficulty maintaining continence: No Do you need assistance with getting out of bed/getting out of a chair/moving?: No Do you have difficulty managing or taking your medications?: No  Follow up appointments reviewed: PCP Follow-up appointment confirmed?: NA (Patient has been in touch with cardiology) Specialist Hospital Follow-up appointment confirmed?: Yes Date of Specialist follow-up appointment?: 04/22/23 (stress test) Follow-Up Specialty Provider:: Stress test Do you need transportation to your follow-up appointment?: No Do you understand care options if your condition(s) worsen?: Yes-patient verbalized understanding    SIGNATURE: Modesto Charon, RN BSN

## 2023-05-22 ENCOUNTER — Encounter: Payer: Self-pay | Admitting: Emergency Medicine

## 2023-05-22 ENCOUNTER — Ambulatory Visit
Admission: EM | Admit: 2023-05-22 | Discharge: 2023-05-22 | Disposition: A | Discharge status: Home / Self Care | Payer: 59 | Attending: Family Medicine | Admitting: Family Medicine

## 2023-05-22 DIAGNOSIS — H6693 Otitis media, unspecified, bilateral: Secondary | ICD-10-CM

## 2023-05-22 DIAGNOSIS — H6123 Impacted cerumen, bilateral: Secondary | ICD-10-CM | POA: Diagnosis not present

## 2023-05-22 MED ORDER — AMOXICILLIN-POT CLAVULANATE 875-125 MG PO TABS: 1.0000 | ORAL_TABLET | Freq: Two times a day (BID) | ORAL | 0 refills | Status: AC

## 2023-05-22 NOTE — ED Triage Notes (Signed)
Ear fullness x 3 weeks  Hx of cerumen impaction in the past  Pt has had ears cleaned in past at pcp  Using debrox at home x 2 - min  drainage

## 2023-05-22 NOTE — ED Provider Notes (Signed)
[facility]    CSN: [id number] Arrival date & time: 05/22/23  1028      History   Chief Complaint Chief Complaint  Patient presents with   Ear Fullness    bilateral    HPI [name] is a 47 y.o. male.   HPI Pleasant 47 year old male presents with bilateral ear fullness for 3 weeks.  Reports history of cerumen impaction in the past.  PMH significant for T2DM without complication, HLD, and HTN.  Past Medical History:  Diagnosis Date   COVID-19    Diabetes mellitus without complication (HCC)    Pre-hypertension    Reflux     Patient Active Problem List   Diagnosis Date Noted   Discomfort of groin 07/01/2021   Hyperlipidemia 05/24/2021   Overweight with body mass index (BMI) of 29 to 29.9 in adult 05/24/2021   Non-smoker 05/23/2021   Diabetes mellitus without complication (HCC) 01/21/2020   BACK PAIN, LUMBAR 05/29/2010   Essential hypertension 11/20/2009   Gastroesophageal reflux disease without esophagitis 11/20/2009   CHEST PAIN UNSPECIFIED 11/20/2009    Past Surgical History:  Procedure Laterality Date   BIOPSY  09/04/2022   Procedure: BIOPSY;  Surgeon: Willis Modena, MD;  Location: [facility];  Service: Gastroenterology;;   ESOPHAGOGASTRODUODENOSCOPY N/A 09/04/2022   Procedure: ESOPHAGOGASTRODUODENOSCOPY (EGD);  Surgeon: Willis Modena, MD;  Location: [facility];  Service: Gastroenterology;  Laterality: N/A;   NO PAST SURGERIES         Home Medications    Prior to Admission medications   Medication Sig Start Date End Date Taking? Authorizing Provider  amoxicillin-clavulanate (AUGMENTIN) 875-125 MG tablet Take 1 tablet by mouth 2 (two) times daily for 10 days. 05/22/23 06/01/23 Yes Trevor Iha, FNP  atorvastatin (LIPITOR) 20 MG tablet Take 1 tablet (20 mg) by mouth daily. 03/13/23   Christen Butter, NP  lisinopril-hydrochlorothiazide (ZESTORETIC) 20-12.5 MG tablet Take 1 tablet by mouth daily. For blood pressure 03/13/23   Christen Butter,  NP  pantoprazole (PROTONIX) 20 MG tablet Take 1 tablet (20 mg total) by mouth daily. 03/13/23   Maretta Bees, PA    Family History Family History  Problem Relation Age of Onset   Hypertension Mother    Hypertension Father    Parkinsonism Father    Cancer Neg Hx    COPD Neg Hx    Depression Neg Hx    Hyperlipidemia Neg Hx    Kidney disease Neg Hx    Stroke Neg Hx     Social History Social History   Tobacco Use   Smoking status: Never   Smokeless tobacco: Never  Vaping Use   Vaping Use: Never used  Substance Use Topics   Alcohol use: Not Currently   Drug use: No     Allergies   Latex   Review of Systems Review of Systems   Physical Exam Triage Vital Signs ED Triage Vitals  Enc Vitals Group     BP 05/22/23 1034 (!) 145/84     Pulse Rate 05/22/23 1034 80     Resp 05/22/23 1034 14     Temp 05/22/23 1034 98.7 F (37.1 C)     Temp Source 05/22/23 1034 Oral     SpO2 05/22/23 1034 98 %     Weight 05/22/23 1036 188 lb (85.3 kg)     Height 05/22/23 1036 5\' 8"  (1.727 m)     Head Circumference --      Peak Flow --      Pain  Score 05/22/23 1036 0     Pain Loc --      Pain Edu? --      Excl. in GC? --    No data found.  Updated Vital Signs BP (!) 145/84 (BP Location: Left Arm) Comment: white coat syndrome  Pulse 80   Temp 98.7 F (37.1 C) (Oral)   Resp 14   Ht 5\' 8"  (1.727 m)   Wt 188 lb (85.3 kg)   SpO2 98%   BMI 28.59 kg/m   Visual Acuity Right Eye Distance:   Left Eye Distance:   Bilateral Distance:    Right Eye Near:   Left Eye Near:    Bilateral Near:     Physical Exam Vitals and nursing note reviewed.  Constitutional:      Appearance: Normal appearance. He is normal weight.  HENT:     Head: Normocephalic and atraumatic.     Right Ear: External ear normal.     Left Ear: External ear normal.     Ears:     Comments: Bilateral EACs occluded with cerumen unable to visualize either TM.  Post bilateral ear lavage: Left EAC-clear, mildly  erythematous from previous cerumen accumulation, Left TM-red rimmed, retracted; Right EAC-clear, mildly erythematous from previous cerumen accumulation, Right TM-red rimmed, retracted    Mouth/Throat:     Mouth: Mucous membranes are moist.     Pharynx: Oropharynx is clear.  Eyes:     Extraocular Movements: Extraocular movements intact.     Conjunctiva/sclera: Conjunctivae normal.     Pupils: Pupils are equal, round, and reactive to light.  Cardiovascular:     Rate and Rhythm: Normal rate and regular rhythm.     Pulses: Normal pulses.     Heart sounds: Normal heart sounds.  Pulmonary:     Effort: Pulmonary effort is normal.     Breath sounds: Normal breath sounds. No wheezing, rhonchi or rales.  Musculoskeletal:        General: Normal range of motion.     Cervical back: Normal range of motion and neck supple.  Skin:    General: Skin is warm and dry.  Neurological:     General: No focal deficit present.     Mental Status: He is alert and oriented to person, place, and time. Mental status is at baseline.  Psychiatric:        Mood and Affect: Mood normal.        Behavior: Behavior normal.        Thought Content: Thought content normal.      UC Treatments / Results  Labs (all labs ordered are listed, but only abnormal results are displayed) Labs Reviewed - No data to display  EKG   Radiology No results found.  Procedures Procedures (including critical care time)  Medications Ordered in UC Medications - No data to display  Initial Impression / Assessment and Plan / UC Course  I have reviewed the triage vital signs and the nursing notes.  Pertinent labs & imaging results that were available during my care of the patient were reviewed by me and considered in my medical decision making (see chart for details).     MDM: 1.  Acute bilateral otitis media-Rx'd Augmentin 875/125 mg tablet twice daily x 10 days; 2.  Bilateral impacted cerumen- resolved with bilateral ear  lavage. Instructed patient to take medication as directed with food to completion.  Encouraged increase daily water intake to 64 ounces per day while taking these medications.  Advised patient not to submerge head underwater for the next 10 days.  Advised if symptoms worsen and/or unresolved please follow-up with PCP or here for further evaluation.  Patient discharged home, hemodynamically stable. Final Clinical Impressions(s) / UC Diagnoses   Final diagnoses:  Bilateral impacted cerumen  Acute bilateral otitis media     Discharge Instructions      Instructed patient to take medication as directed with food to completion.  Encouraged increase daily water intake to 64 ounces per day while taking these medications.  Advised patient not to submerge head underwater for the next 10 days.  Advised if symptoms worsen and/or unresolved please follow-up with PCP or here for further evaluation.     ED Prescriptions     Medication Sig Dispense Auth. Provider   amoxicillin-clavulanate (AUGMENTIN) 875-125 MG tablet Take 1 tablet by mouth 2 (two) times daily for 10 days. 20 tablet Trevor Iha, FNP      PDMP not reviewed this encounter.   Trevor Iha, FNP 05/22/23 1130

## 2023-05-22 NOTE — Discharge Instructions (Addendum)
Instructed patient to take medication as directed with food to completion.  Encouraged increase daily water intake to 64 ounces per day while taking these medications.  Advised patient not to submerge head underwater for the next 10 days.  Advised if symptoms worsen and/or unresolved please follow-up with PCP or here for further evaluation.

## 2023-07-23 ENCOUNTER — Encounter: Payer: Self-pay | Admitting: Medical-Surgical

## 2023-07-25 ENCOUNTER — Ambulatory Visit: Payer: Self-pay | Admitting: Medical-Surgical

## 2023-09-07 ENCOUNTER — Encounter: Payer: Self-pay | Admitting: Emergency Medicine

## 2023-09-07 ENCOUNTER — Other Ambulatory Visit: Payer: Self-pay

## 2023-09-07 ENCOUNTER — Ambulatory Visit
Admission: EM | Admit: 2023-09-07 | Discharge: 2023-09-07 | Disposition: A | Discharge status: Home / Self Care | Payer: 59 | Attending: Family Medicine | Admitting: Family Medicine

## 2023-09-07 DIAGNOSIS — S39012A Strain of muscle, fascia and tendon of lower back, initial encounter: Secondary | ICD-10-CM | POA: Diagnosis not present

## 2023-09-07 DIAGNOSIS — M545 Low back pain, unspecified: Secondary | ICD-10-CM

## 2023-09-07 MED ORDER — PREDNISONE 10 MG (21) PO TBPK: ORAL_TABLET | Freq: Every day | ORAL | 0 refills | Status: DC

## 2023-09-07 NOTE — Discharge Instructions (Addendum)
Directed patient to take medication as directed with food to completion.  Encouraged to increase daily water intake to 64 ounces per day while taking this medication.  Advised patient to avoid offending activities of lower back for the next 7 to 10 days.  Advised if symptoms worsen and/or unresolved please follow-up with PCP or here for further evaluation.

## 2023-09-07 NOTE — ED Triage Notes (Addendum)
Patient presents to Urgent Care with complaints of low back pain since 1 week ago. Patient reports pain does movement, worst with sitting, sleeping in fetal position. The pain is annoying more than anything. Has tried Advil which does help some. No changes with urination habits.

## 2023-09-07 NOTE — ED Provider Notes (Signed)
[name]    CSN: [id number] Arrival date & time: 09/07/23  1513      History   Chief Complaint Chief Complaint  Patient presents with   Back Pain    HPI Robert Glover is a 47 y.o. male.   HPI Robert Glover 47 year old male presents with lower back pain x 1 week.  Patient denies injury or insult to lower back. Patient reports pain is worse with movement. PMH significant for obesity, T2DM without complication, and lumbar back pain.  Past Medical History:  Diagnosis Date   COVID-19    Diabetes mellitus without complication (HCC)    Pre-hypertension    Reflux     Patient Active Problem List   Diagnosis Date Noted   Discomfort of groin 07/01/2021   Hyperlipidemia 05/24/2021   Overweight with body mass index (BMI) of 29 to 29.9 in adult 05/24/2021   Non-smoker 05/23/2021   Diabetes mellitus without complication (HCC) 01/21/2020   BACK PAIN, LUMBAR 05/29/2010   Essential hypertension 11/20/2009   Gastroesophageal reflux disease without esophagitis 11/20/2009   CHEST PAIN UNSPECIFIED 11/20/2009    Past Surgical History:  Procedure Laterality Date   BIOPSY  09/04/2022   Procedure: BIOPSY;  Surgeon: Willis Modena, MD;  Location: [facility];  Service: Gastroenterology;;   ESOPHAGOGASTRODUODENOSCOPY N/A 09/04/2022   Procedure: ESOPHAGOGASTRODUODENOSCOPY (EGD);  Surgeon: Willis Modena, MD;  Location: [facility];  Service: Gastroenterology;  Laterality: N/A;   NO PAST SURGERIES         Home Medications    Prior to Admission medications   Medication Sig Start Date End Date Taking? Authorizing Provider  atorvastatin (LIPITOR) 20 MG tablet Take 1 tablet (20 mg) by mouth daily. 03/13/23  Yes Christen Butter, NP  lisinopril-hydrochlorothiazide (ZESTORETIC) 20-12.5 MG tablet Take 1 tablet by mouth daily. For blood pressure 03/13/23  Yes Jessup, Joy, NP  pantoprazole (PROTONIX) 20 MG tablet Take 1 tablet (20 mg total) by mouth daily. 03/13/23  Yes Crain, Whitney  L, PA  predniSONE (STERAPRED UNI-PAK 21 TAB) 10 MG (21) TBPK tablet Take by mouth daily. Take 6 tabs by mouth daily  for 2 days, then 5 tabs for 2 days, then 4 tabs for 2 days, then 3 tabs for 2 days, 2 tabs for 2 days, then 1 tab by mouth daily for 2 days 09/07/23  Yes Trevor Iha, FNP    Family History Family History  Problem Relation Age of Onset   Hypertension Mother    Hypertension Father    Parkinsonism Father    Cancer Neg Hx    COPD Neg Hx    Depression Neg Hx    Hyperlipidemia Neg Hx    Kidney disease Neg Hx    Stroke Neg Hx     Social History Social History   Tobacco Use   Smoking status: Never   Smokeless tobacco: Never  Vaping Use   Vaping status: Never Used  Substance Use Topics   Alcohol use: Not Currently   Drug use: No     Allergies   Latex   Review of Systems Review of Systems  Musculoskeletal:  Positive for back pain.     Physical Exam Triage Vital Signs ED Triage Vitals  Encounter Vitals Group     BP      Systolic BP Percentile      Diastolic BP Percentile      Pulse      Resp      Temp      Temp src  SpO2      Weight      Height      Head Circumference      Peak Flow      Pain Score      Pain Loc      Pain Education      Exclude from Growth Chart    No data found.  Updated Vital Signs BP (!) 188/113 (BP Location: Right Arm)   Pulse (!) 105   Temp 99.2 F (37.3 C) (Oral)   Resp 18   SpO2 97%     Physical Exam Vitals and nursing note reviewed.  Constitutional:      Appearance: Normal appearance. He is obese.  HENT:     Head: Normocephalic and atraumatic.     Mouth/Throat:     Mouth: Mucous membranes are moist.     Pharynx: Oropharynx is clear.  Eyes:     Extraocular Movements: Extraocular movements intact.     Conjunctiva/sclera: Conjunctivae normal.     Pupils: Pupils are equal, round, and reactive to light.  Cardiovascular:     Rate and Rhythm: Normal rate and regular rhythm.     Pulses: Normal pulses.      Heart sounds: Normal heart sounds.  Pulmonary:     Effort: Pulmonary effort is normal.     Breath sounds: Normal breath sounds. No wheezing, rhonchi or rales.  Musculoskeletal:        General: Normal range of motion.     Cervical back: Normal range of motion and neck supple.     Comments: Patient reports tenderness over inferior aspect of lumbar spine, spinous processes paraspinous muscles, inferior spinal erectors, reports mildly worse with movement  Skin:    General: Skin is warm and dry.  Neurological:     General: No focal deficit present.     Mental Status: He is alert and oriented to person, place, and time. Mental status is at baseline.  Psychiatric:        Mood and Affect: Mood normal.        Behavior: Behavior normal.        Thought Content: Thought content normal.      UC Treatments / Results  Labs (all labs ordered are listed, but only abnormal results are displayed) Labs Reviewed - No data to display  EKG   Radiology No results found.  Procedures Procedures (including critical care time)  Medications Ordered in UC Medications - No data to display  Initial Impression / Assessment and Plan / UC Course  I have reviewed the triage vital signs and the nursing notes.  Pertinent labs & imaging results that were available during my care of the patient were reviewed by me and considered in my medical decision making (see chart for details).     MDM: 1.  Acute midline low back pain without sciatica-Rx'd Sterapred Unipak (tapering from 60 mg to 10 mg over 10 days); 2.  Strain of lumbar region, initial encounter same as 1.-Rx'd Sterapred Unipak (tapering from 60 mg to 10 mg over 10 days). Directed patient to take medication as directed with food to completion.  Encouraged to increase daily water intake to 64 ounces per day while taking this medication.  Advised patient to avoid offending activities of lower back for the next 7 to 10 days.  Advised if symptoms worsen  and/or unresolved please follow-up with PCP or here for further evaluation.   Final Clinical Impressions(s) / UC Diagnoses   Final diagnoses:  Acute midline low back pain without sciatica  Strain of lumbar region, initial encounter     Discharge Instructions      Directed patient to take medication as directed with food to completion.  Encouraged to increase daily water intake to 64 ounces per day while taking this medication.  Advised patient to avoid offending activities of lower back for the next 7 to 10 days.  Advised if symptoms worsen and/or unresolved please follow-up with PCP or here for further evaluation.     ED Prescriptions     Medication Sig Dispense Auth. Provider   predniSONE (STERAPRED UNI-PAK 21 TAB) 10 MG (21) TBPK tablet Take by mouth daily. Take 6 tabs by mouth daily  for 2 days, then 5 tabs for 2 days, then 4 tabs for 2 days, then 3 tabs for 2 days, 2 tabs for 2 days, then 1 tab by mouth daily for 2 days 42 tablet Trevor Iha, FNP      PDMP not reviewed this encounter.   Trevor Iha, FNP 09/07/23 1555

## 2023-10-02 ENCOUNTER — Telehealth: Payer: Self-pay | Admitting: Medical-Surgical

## 2023-10-02 NOTE — Telephone Encounter (Signed)
Please contact patient. He is well overdue for follow up on Diabetes and HTN. Please facilitate scheduling a follow up appt at his earliest convenience.  ___________________________________________ Thayer Ohm, DNP, APRN, FNP-BC Primary Care and Sports Medicine [facility]

## 2024-03-01 ENCOUNTER — Ambulatory Visit: Payer: 59 | Admitting: Medical-Surgical

## 2024-03-01 ENCOUNTER — Other Ambulatory Visit: Payer: Self-pay | Admitting: Medical-Surgical

## 2024-03-01 DIAGNOSIS — E119 Type 2 diabetes mellitus without complications: Secondary | ICD-10-CM

## 2024-03-01 DIAGNOSIS — I10 Essential (primary) hypertension: Secondary | ICD-10-CM

## 2024-03-25 LAB — HM DIABETES EYE EXAM

## 2024-04-27 ENCOUNTER — Encounter: Payer: Self-pay | Admitting: Medical-Surgical

## 2024-04-27 ENCOUNTER — Ambulatory Visit (INDEPENDENT_AMBULATORY_CARE_PROVIDER_SITE_OTHER): Payer: 59 | Admitting: Medical-Surgical

## 2024-04-27 VITALS — BP 129/81 | HR 81 | Resp 20 | Ht 68.0 in | Wt 196.0 lb

## 2024-04-27 DIAGNOSIS — I10 Essential (primary) hypertension: Secondary | ICD-10-CM

## 2024-04-27 DIAGNOSIS — K219 Gastro-esophageal reflux disease without esophagitis: Secondary | ICD-10-CM

## 2024-04-27 DIAGNOSIS — E119 Type 2 diabetes mellitus without complications: Secondary | ICD-10-CM | POA: Diagnosis not present

## 2024-04-27 DIAGNOSIS — E782 Mixed hyperlipidemia: Secondary | ICD-10-CM

## 2024-04-27 DIAGNOSIS — Z23 Encounter for immunization: Secondary | ICD-10-CM

## 2024-04-27 LAB — POCT UA - MICROALBUMIN
Albumin/Creatinine Ratio, Urine, POC: <30
Creatinine, POC: 50 mg/dL
Microalbumin Ur, POC: 10 mg/L

## 2024-04-27 LAB — POCT GLYCOSYLATED HEMOGLOBIN (HGB A1C)
HbA1c, POC (controlled diabetic range): 6.6 % (ref 0.0–7.0)
Hemoglobin A1C: 6.6 % — AB (ref 4.0–5.6)

## 2024-04-27 MED ORDER — LISINOPRIL-HYDROCHLOROTHIAZIDE 20-12.5 MG PO TABS: 1.0000 | ORAL_TABLET | Freq: Every day | ORAL | 3 refills | Status: DC

## 2024-04-27 MED ORDER — ATORVASTATIN CALCIUM 20 MG PO TABS: 20.0000 mg | ORAL_TABLET | Freq: Every day | ORAL | 3 refills | Status: DC

## 2024-04-27 NOTE — Progress Notes (Unsigned)
        Established patient visit  History, exam, impression, and plan:  1. Essential hypertension (Primary) Very pleasant 48 year old male presenting today for follow-up on hypertension.  He is currently taking lisinopril -hydrochlorothiazide  20-12.5 mg daily, tolerating well without side effects.  Has been on this long-term with favorable response.  Does not regularly check blood pressures at home.  Following a low-sodium diet.  Has a physically active job especially during the spring and summer seasons.  Denies concerning symptoms today.  Cardiopulmonary exam is normal.  Blood pressure elevated on arrival however recheck was at goal.  Continue lisinopril -HCTZ as prescribed. - lisinopril -hydrochlorothiazide  (ZESTORETIC ) 20-12.5 MG tablet; Take 1 tablet by mouth daily. For blood pressure  Dispense: 90 tablet; Refill: 3  2. Gastroesophageal reflux disease without esophagitis Long history of issues with GERD currently treated with omeprazole 40 mg daily.  Had a recent ED visit with significant chest pain which ended up being reflux.  He was given a GI cocktail with full resolution of his symptoms.  Reports since this hospitalization he has actually not had any further issues with reflux symptoms.  Denies abdominal pain, nausea, vomiting, melena, and hematochezia.  Abdomen soft, nontender, nondistended.  Bowel sounds positive x 4 quadrants.  Given long history of GI concerns and chronic nature of GERD, continue omeprazole as prescribed.  3. Diabetes mellitus without complication (HCC) History of diabetes that was previously diet and exercise controlled.  He is not on any medications at this time and has not been for the last couple of years.  Overdue for diabetic follow-up.  Diabetic foot exam completed today without concerns.  Microalbumin normal.  A1c at 6.6%.  Doing well overall so no need to add medication at this time.  He is on a statin so would like him to continue this.  Continue diet/lifestyle  modifications for diabetes control. - HM Diabetes Foot Exam - POCT HgB A1C - POCT UA - Microalbumin - atorvastatin  (LIPITOR) 20 MG tablet; Take 1 tablet (20 mg total) by mouth daily.  Dispense: 90 tablet; Refill: 3  4. Mixed hyperlipidemia Checking lipid panel today.  He has been taking Lipitor 20 mg daily, tolerating well without side effects.  Reports being consistent with dosing.  As noted above, his job is very physically active especially during the seasons of spring and summer.  Working to follow a low-fat heart healthy diet.  Continue Lipitor as prescribed. - Lipid panel  5. Need for pneumococcal 20-valent conjugate vaccination Prevnar 20 given in office today. - Pneumococcal conjugate vaccine 20-valent (Prevnar 20)   Procedures performed this visit: None.  Return in about 6 months (around 10/27/2024) for DM/HTN/HLD follow up.  __________________________________ Maryl Snook, DNP, APRN, FNP-BC Primary Care and Sports Medicine [facility]

## 2024-04-28 ENCOUNTER — Encounter: Payer: Self-pay | Admitting: Medical-Surgical

## 2024-04-28 LAB — LIPID PANEL
Chol/HDL Ratio: 3.4 ratio (ref 0.0–5.0)
Cholesterol, Total: 133 mg/dL (ref 100–199)
HDL: 39 mg/dL — ABNORMAL LOW (ref >39)
LDL Chol Calc (NIH): 72 mg/dL (ref 0–99)
Triglycerides: 125 mg/dL (ref 0–149)
VLDL Cholesterol Cal: 22 mg/dL (ref 5–40)

## 2024-06-02 ENCOUNTER — Telehealth: Payer: Self-pay | Admitting: Medical-Surgical

## 2024-06-02 ENCOUNTER — Encounter: Payer: Self-pay | Admitting: Medical-Surgical

## 2024-06-02 NOTE — Telephone Encounter (Signed)
 Patient dropped off document Request for Medical Consultation, to be filled out by provider. Patient requested to send it back via Fax within ASAP. Document is located in providers tray at front office.Please advise at [facility] [phone]

## 2024-08-06 ENCOUNTER — Encounter: Payer: Self-pay | Admitting: Medical-Surgical

## 2024-08-06 DIAGNOSIS — E119 Type 2 diabetes mellitus without complications: Secondary | ICD-10-CM

## 2024-08-06 DIAGNOSIS — I10 Essential (primary) hypertension: Secondary | ICD-10-CM

## 2024-08-06 MED ORDER — ATORVASTATIN CALCIUM 20 MG PO TABS: 20.0000 mg | ORAL_TABLET | Freq: Every day | ORAL | 0 refills | Status: DC

## 2024-08-06 MED ORDER — LISINOPRIL-HYDROCHLOROTHIAZIDE 20-12.5 MG PO TABS: 1.0000 | ORAL_TABLET | Freq: Every day | ORAL | 0 refills | Status: DC

## 2024-08-06 NOTE — Telephone Encounter (Signed)
 Am not sure if you can prescribe out of state Pended prescriptions and pharmacy requested.

## 2024-08-25 ENCOUNTER — Ambulatory Visit: Admission: EM | Admit: 2024-08-25 | Discharge: 2024-08-25 | Disposition: A | Discharge status: Home / Self Care

## 2024-08-25 DIAGNOSIS — H6123 Impacted cerumen, bilateral: Secondary | ICD-10-CM | POA: Diagnosis not present

## 2024-08-25 NOTE — ED Provider Notes (Signed)
 [facility]    CSN: [id number] Arrival date & time: 08/25/24  1111      History   Chief Complaint Chief Complaint  Patient presents with   Ear Fullness    bilateral    HPI Robert Glover is a 48 y.o. male.   HPI 48 year old male presents with bilateral ear.  PMH significant for T2DM without complication, HLD, and GERD.  Past Medical History:  Diagnosis Date   COVID-19    Diabetes mellitus without complication (HCC)    Pre-hypertension    Reflux     Patient Active Problem List   Diagnosis Date Noted   Discomfort of groin 07/01/2021   Hyperlipidemia 05/24/2021   Overweight with body mass index (BMI) of 29 to 29.9 in adult 05/24/2021   Non-smoker 05/23/2021   Diabetes mellitus without complication (HCC) 01/21/2020   BACK PAIN, LUMBAR 05/29/2010   Essential hypertension 11/20/2009   Gastroesophageal reflux disease without esophagitis 11/20/2009   CHEST PAIN UNSPECIFIED 11/20/2009    Past Surgical History:  Procedure Laterality Date   BIOPSY  09/04/2022   Procedure: BIOPSY;  Surgeon: Burnette Fallow, MD;  Location: [facility];  Service: Gastroenterology;;   ESOPHAGOGASTRODUODENOSCOPY N/A 09/04/2022   Procedure: ESOPHAGOGASTRODUODENOSCOPY (EGD);  Surgeon: Burnette Fallow, MD;  Location: [facility];  Service: Gastroenterology;  Laterality: N/A;   NO PAST SURGERIES         Home Medications    Prior to Admission medications   Medication Sig Start Date End Date Taking? Authorizing Provider  atorvastatin  (LIPITOR) 20 MG tablet Take 1 tablet (20 mg total) by mouth daily. 08/06/24   Willo Mini, NP  lisinopril -hydrochlorothiazide  (ZESTORETIC ) 20-12.5 MG tablet Take 1 tablet by mouth daily. For blood pressure 08/06/24   Willo Mini, NP  omeprazole (PRILOSEC) 40 MG capsule Take 40 mg by mouth daily.    [provider]    Family History Family History  Problem Relation Age of Onset   Hypertension Mother    Hypertension Father     Parkinsonism Father    Cancer Neg Hx    COPD Neg Hx    Depression Neg Hx    Hyperlipidemia Neg Hx    Kidney disease Neg Hx    Stroke Neg Hx     Social History Social History   Tobacco Use   Smoking status: Never   Smokeless tobacco: Never  Vaping Use   Vaping status: Never Used  Substance Use Topics   Alcohol use: Not Currently   Drug use: No     Allergies   Latex   Review of Systems Review of Systems  HENT:  Positive for ear pain.        Bilateral ear fullness with history of impacted cerumen  All other systems reviewed and are negative.    Physical Exam Triage Vital Signs ED Triage Vitals  Encounter Vitals Group     BP      Girls Systolic BP Percentile      Girls Diastolic BP Percentile      Boys Systolic BP Percentile      Boys Diastolic BP Percentile      Pulse      Resp      Temp      Temp src      SpO2      Weight      Height      Head Circumference      Peak Flow      Pain Score  Pain Loc      Pain Education      Exclude from Growth Chart    No data found.  Updated Vital Signs BP 135/85 (BP Location: Right Arm)   Pulse 89   Temp 98.5 F (36.9 C) (Oral)   Resp 17   SpO2 97%    Physical Exam Vitals and nursing note reviewed.  Constitutional:      General: He is not in acute distress.    Appearance: Normal appearance. He is normal weight. He is not ill-appearing.  HENT:     Head: Normocephalic and atraumatic.     Right Ear: External ear normal.     Left Ear: External ear normal.     Ears:     Comments: Bilateral EACs impacted with excessive cerumen unable to visualize either TM.  Post bilateral ear lavage: Left EAC-clear, Left TM-clear, good light reflex with landmarks noted; Right EAC-clear, Right TM-clear, good light reflex with landmarks noted    Mouth/Throat:     Mouth: Mucous membranes are moist.     Pharynx: Oropharynx is clear.  Eyes:     Extraocular Movements: Extraocular movements intact.     Conjunctiva/sclera:  Conjunctivae normal.     Pupils: Pupils are equal, round, and reactive to light.  Cardiovascular:     Rate and Rhythm: Normal rate and regular rhythm.     Pulses: Normal pulses.     Heart sounds: Normal heart sounds.  Pulmonary:     Effort: Pulmonary effort is normal.     Breath sounds: Normal breath sounds. No wheezing, rhonchi or rales.  Chest:     Chest wall: No tenderness.  Musculoskeletal:        General: Normal range of motion.  Skin:    General: Skin is warm and dry.  Neurological:     General: No focal deficit present.     Mental Status: He is alert and oriented to person, place, and time.  Psychiatric:        Mood and Affect: Mood normal.        Behavior: Behavior normal.        Thought Content: Thought content normal.      UC Treatments / Results  Labs (all labs ordered are listed, but only abnormal results are displayed) Labs Reviewed - No data to display  EKG   Radiology No results found.  Procedures Procedures (including critical care time)  Medications Ordered in UC Medications - No data to display  Initial Impression / Assessment and Plan / UC Course  I have reviewed the triage vital signs and the nursing notes.  Pertinent labs & imaging results that were available during my care of the patient were reviewed by me and considered in my medical decision making (see chart for details).     MDM: 1.  Bilateral impacted cerumen-resolved with bilateral ear lavage. Advised patient if symptoms worsen and/or unresolved please follow-up with your PCP or here for further evaluation.  Patient discharged home, hemodynamically stable Final Clinical Impressions(s) / UC Diagnoses   Final diagnoses:  Bilateral impacted cerumen     Discharge Instructions      Advised patient if symptoms worsen and/or unresolved please follow-up with your PCP or here for further evaluation.     ED Prescriptions   None    PDMP not reviewed this encounter.   Teddy Sharper, FNP 08/25/24 1201

## 2024-08-25 NOTE — ED Triage Notes (Signed)
 Pt c/o bilateral ear fullness and some pain in RT ear pain since yesterday. Hx of impacted cerumen.

## 2024-08-25 NOTE — Discharge Instructions (Addendum)
 Advised patient if symptoms worsen and/or unresolved please follow-up with your PCP or here for further evaluation.

## 2024-08-27 ENCOUNTER — Encounter: Payer: Self-pay | Admitting: Medical-Surgical

## 2024-08-27 ENCOUNTER — Ambulatory Visit (INDEPENDENT_AMBULATORY_CARE_PROVIDER_SITE_OTHER): Admitting: Medical-Surgical

## 2024-08-27 VITALS — BP 115/71 | HR 87 | Resp 20 | Ht 68.0 in | Wt 176.9 lb

## 2024-08-27 DIAGNOSIS — R5383 Other fatigue: Secondary | ICD-10-CM

## 2024-08-27 DIAGNOSIS — E782 Mixed hyperlipidemia: Secondary | ICD-10-CM

## 2024-08-27 DIAGNOSIS — E119 Type 2 diabetes mellitus without complications: Secondary | ICD-10-CM | POA: Diagnosis not present

## 2024-08-27 DIAGNOSIS — I10 Essential (primary) hypertension: Secondary | ICD-10-CM

## 2024-08-27 LAB — POCT GLYCOSYLATED HEMOGLOBIN (HGB A1C): Hemoglobin A1C: 5.6 % (ref 4.0–5.6)

## 2024-08-27 MED ORDER — ATORVASTATIN CALCIUM 20 MG PO TABS: 20.0000 mg | ORAL_TABLET | Freq: Every day | ORAL | 3 refills | Status: DC

## 2024-08-27 MED ORDER — LISINOPRIL-HYDROCHLOROTHIAZIDE 20-12.5 MG PO TABS: 1.0000 | ORAL_TABLET | Freq: Every day | ORAL | 3 refills | Status: DC

## 2024-08-27 NOTE — Progress Notes (Signed)
        Established patient visit   History of Present Illness   Discussed the use of AI scribe software for clinical note transcription with the patient, who gave verbal consent to proceed.  History of Present Illness   Robert Glover is a 48 year old male who presents with dizziness and feeling off balance.  Dizziness and gait imbalance - Dizziness and imbalance began on Sunday, described as 'walking a high wire'. - Onset occurred after standing from a seated position, requiring support to prevent falling. - No associated nausea. - Blood pressure at symptom onset was 115/64, perceived as low by the patient. - Symptoms persisted through Tuesday, improving during ambulation but worsening upon stopping. - By Thursday, symptoms improved; today feels normal.  Recent dental procedure and gastrointestinal symptoms - Underwent major dental procedure two weeks ago, including wisdom tooth extraction, root canals, and crowns. - Completed a 7-day course of antibiotics and pain medication. - Followed a liquid/soft food diet post-procedure. - Developed diarrhea after antibiotics, which resolved with probiotics. - Resumed regular diet on Wednesday, coinciding with improvement in dizziness and imbalance.  Weight loss and gastroesophageal reflux disease - Lost 20 pounds since June. - Weight loss associated with improvement in reflux symptoms.  Chronic disease management - Currently taking Lipitor 20mg  and lisinopril -hctz 20-12.5mg  daily. - Hemoglobin A1c 6.6 in April, diet/lifestyle controlled.     Physical Exam   Physical Exam Vitals reviewed.  Constitutional:      General: He is not in acute distress.    Appearance: Normal appearance. He is not ill-appearing.  HENT:     Head: Normocephalic and atraumatic.  Cardiovascular:     Rate and Rhythm: Normal rate and regular rhythm.     Pulses: Normal pulses.     Heart sounds: Normal heart sounds. No murmur heard.    No  friction rub. No gallop.  Pulmonary:     Effort: Pulmonary effort is normal. No respiratory distress.     Breath sounds: Normal breath sounds.  Skin:    General: Skin is warm and dry.  Neurological:     Mental Status: He is alert and oriented to person, place, and time.  Psychiatric:        Mood and Affect: Mood normal.        Behavior: Behavior normal.        Thought Content: Thought content normal.        Judgment: Judgment normal.    Assessment & Plan   Assessment and Plan    Type 2 diabetes mellitus Well-controlled with A1c improved from 6.6% to 5.6% due to weight loss and lifestyle changes. - Continue following a diabetic diet and exercise regularly. - Maintain regular follow-up appointments.  Essential hypertension Recent blood pressure readings normal. - Continue current lisinopril -hctz.   Mixed hyperlipidemia Managed with Lipitor. Condition appears well-managed. - Continue Lipitor.   Low energy/dizziness Resolved. Likely multifactorial.      Follow up   Return if symptoms worsen or fail to improve. __________________________________ Zada FREDRIK Palin, DNP, APRN, FNP-BC Primary Care and Sports Medicine [facility]

## 2024-11-08 ENCOUNTER — Ambulatory Visit: Admitting: Medical-Surgical

## 2024-11-09 ENCOUNTER — Encounter: Payer: Self-pay | Admitting: Emergency Medicine

## 2024-11-09 ENCOUNTER — Ambulatory Visit
Admission: EM | Admit: 2024-11-09 | Discharge: 2024-11-09 | Disposition: A | Discharge status: Home / Self Care | Attending: Family Medicine | Admitting: Family Medicine

## 2024-11-09 DIAGNOSIS — R079 Chest pain, unspecified: Secondary | ICD-10-CM

## 2024-11-09 NOTE — ED Triage Notes (Signed)
 Patient states that he recently took up running at the gym and worked out yesterday.  After his workout, he had increased central chest burning and nausea.  Patient has a history of acid reflux but since losing weight, the reflux has gotten better.  Denies any SOB or arm pain.  Patient hasn't taken any OTC meds.

## 2024-11-09 NOTE — Discharge Instructions (Addendum)
 Advised patient EKG revealed normal sinus rhythm with normal cardiac exam this evening advised/encouraged patient to gradually progress with cardiovascular exercise duration and intensity while working out.  Encouraged to increase daily water intake to 64 ounces per day 7 days/week.  Advised if symptoms worsen and/or unresolved please follow-up with your PCP or here for further evaluation.

## 2024-11-09 NOTE — ED Provider Notes (Incomplete)
 [name]    CSN: [id number] Arrival date & time: 11/09/24  1731      History   Chief Complaint Chief Complaint  Patient presents with  . Chest Pain    HPI Robert Glover is a 48 y.o. male.   HPI 48 year old male presents with nonspecific chest pain since yesterday evening.  Patient reports may have increased intensity too much while running on treadmill.  Reports shortly thereafter feeling burning pressure sensation of chest that resolved after 2 to 3 hours of rest.  Patient reports 40 pound body weight reduction since January of this year.  PMH significant for T2DM without complication, chest pain unspecified, HTN, and HLD.  Past Medical History:  Diagnosis Date  . COVID-19   . Diabetes mellitus without complication (HCC)   . Pre-hypertension   . Reflux     Patient Active Problem List   Diagnosis Date Noted  . Discomfort of groin 07/01/2021  . Hyperlipidemia 05/24/2021  . Overweight with body mass index (BMI) of 29 to 29.9 in adult 05/24/2021  . Non-smoker 05/23/2021  . Diabetes mellitus without complication (HCC) 01/21/2020  . BACK PAIN, LUMBAR 05/29/2010  . Essential hypertension 11/20/2009  . Gastroesophageal reflux disease without esophagitis 11/20/2009  . CHEST PAIN UNSPECIFIED 11/20/2009    Past Surgical History:  Procedure Laterality Date  . BIOPSY  09/04/2022   Procedure: BIOPSY;  Surgeon: Burnette Fallow, MD;  Location: [facility];  Service: Gastroenterology;;  . ESOPHAGOGASTRODUODENOSCOPY N/A 09/04/2022   Procedure: ESOPHAGOGASTRODUODENOSCOPY (EGD);  Surgeon: Burnette Fallow, MD;  Location: [facility];  Service: Gastroenterology;  Laterality: N/A;  . NO PAST SURGERIES         Home Medications    Prior to Admission medications   Medication Sig Start Date End Date Taking? Authorizing Provider  atorvastatin  (LIPITOR) 20 MG tablet Take 1 tablet (20 mg total) by mouth daily. 08/27/24  Yes Willo Mini, NP   lisinopril -hydrochlorothiazide  (ZESTORETIC ) 20-12.5 MG tablet Take 1 tablet by mouth daily. For blood pressure 08/27/24  Yes Willo Mini, NP    Family History Family History  Problem Relation Age of Onset  . Hypertension Mother   . Hypertension Father   . Parkinsonism Father   . Cancer Neg Hx   . COPD Neg Hx   . Depression Neg Hx   . Hyperlipidemia Neg Hx   . Kidney disease Neg Hx   . Stroke Neg Hx     Social History Social History   Tobacco Use  . Smoking status: Never  . Smokeless tobacco: Never  Vaping Use  . Vaping status: Never Used  Substance Use Topics  . Alcohol use: Not Currently  . Drug use: No     Allergies   Latex   Review of Systems Review of Systems  Cardiovascular:  Positive for chest pain.     Physical Exam Triage Vital Signs ED Triage Vitals  Encounter Vitals Group     BP      Girls Systolic BP Percentile      Girls Diastolic BP Percentile      Boys Systolic BP Percentile      Boys Diastolic BP Percentile      Pulse      Resp      Temp      Temp src      SpO2      Weight      Height      Head Circumference      Peak Flow  Pain Score      Pain Loc      Pain Education      Exclude from Growth Chart    No data found.  Updated Vital Signs BP 136/78 (BP Location: Left Arm)   Pulse 80   Temp 98.4 F (36.9 C) (Oral)   Resp 18   Ht 5' 8 (1.727 m)   Wt 159 lb (72.1 kg)   SpO2 97%   BMI 24.18 kg/m    Physical Exam Vitals and nursing note reviewed.  Constitutional:      General: He is not in acute distress.    Appearance: Normal appearance. He is not ill-appearing.  HENT:     Head: Normocephalic and atraumatic.     Mouth/Throat:     Mouth: Mucous membranes are moist.     Pharynx: Oropharynx is clear.  Eyes:     Extraocular Movements: Extraocular movements intact.     Conjunctiva/sclera: Conjunctivae normal.     Pupils: Pupils are equal, round, and reactive to light.  Cardiovascular:     Rate and Rhythm: Normal  rate and regular rhythm.     Heart sounds: Normal heart sounds. No murmur heard.    No friction rub. No gallop.  Pulmonary:     Effort: Pulmonary effort is normal.     Breath sounds: Normal breath sounds. No wheezing, rhonchi or rales.  Musculoskeletal:        General: Normal range of motion.  Skin:    General: Skin is warm and dry.  Neurological:     General: No focal deficit present.     Mental Status: He is alert and oriented to person, place, and time. Mental status is at baseline.  Psychiatric:        Mood and Affect: Mood normal.        Behavior: Behavior normal.      UC Treatments / Results  Labs (all labs ordered are listed, but only abnormal results are displayed) Labs Reviewed - No data to display  EKG   Radiology No results found.  Procedures Procedures (including critical care time)  Medications Ordered in UC Medications - No data to display  Initial Impression / Assessment and Plan / UC Course  I have reviewed the triage vital signs and the nursing notes.  Pertinent labs & imaging results that were available during my care of the patient were reviewed by me and considered in my medical decision making (see chart for details).     MDM: 1.  Nonspecific chest pain-EKG reveals NSR similar in appearance to previous of 03/12/2023. Advised patient EKG revealed normal sinus rhythm with normal cardiac exam this evening advised/encouraged patient to gradually progress with cardiovascular exercise duration and intensity while working out.  Encouraged to increase daily water intake to 64 ounces per day 7 days/week.  Advised if symptoms worsen and/or unresolved please follow-up with your PCP or here for further evaluation.  Patient discharged home, hemodynamically stable. Final Clinical Impressions(s) / UC Diagnoses   Final diagnoses:  Nonspecific chest pain     Discharge Instructions      Advised patient EKG revealed normal sinus rhythm with normal cardiac exam  this evening advised/encouraged patient to gradually progress with cardiovascular exercise duration and intensity while working out.  Encouraged to increase daily water intake to 64 ounces per day 7 days/week.  Advised if symptoms worsen and/or unresolved please follow-up with your PCP or here for further evaluation.     ED Prescriptions  None    PDMP not reviewed this encounter.

## 2024-11-10 ENCOUNTER — Encounter: Payer: Self-pay | Admitting: Emergency Medicine

## 2024-11-10 ENCOUNTER — Ambulatory Visit
Admission: EM | Admit: 2024-11-10 | Discharge: 2024-11-10 | Disposition: A | Discharge status: Home / Self Care | Attending: Family Medicine | Admitting: Family Medicine

## 2024-11-10 DIAGNOSIS — R300 Dysuria: Secondary | ICD-10-CM

## 2024-11-10 LAB — POCT URINALYSIS DIP (MANUAL ENTRY)
Bilirubin, UA: NEGATIVE
Blood, UA: NEGATIVE
Glucose, UA: NEGATIVE mg/dL
Leukocytes, UA: NEGATIVE
Nitrite, UA: NEGATIVE
Protein Ur, POC: NEGATIVE mg/dL
Spec Grav, UA: <=1.005 — AB (ref 1.010–1.025)
Urobilinogen, UA: 0.2 U/dL
pH, UA: 5.5 (ref 5.0–8.0)

## 2024-11-10 NOTE — Discharge Instructions (Signed)
 No abnormality found on urinalysis Follow-up with your primary care doctor

## 2024-11-10 NOTE — ED Provider Notes (Signed)
 [facility]    CSN: [id number] Arrival date & time: 11/10/24  1806      History   Chief Complaint Chief Complaint  Patient presents with   Dysuria    HPI Robert Glover is a 48 y.o. male.   Patient is here for dysuria.  He states on 2 occasions over the last week he has had dysuria.  He states he will have some burning in the tip of his penis with urination.  It will be sore for short period of time.  He takes an Advil and it goes away.  The rest the time he feels fine.  No nocturia.  No frequency.  No hematuria.  He states he has had a history of prostatitis but he is not having any prostate symptoms or difficulty urinating.    Past Medical History:  Diagnosis Date   COVID-19    Diabetes mellitus without complication (HCC)    Pre-hypertension    Reflux     Patient Active Problem List   Diagnosis Date Noted   Discomfort of groin 07/01/2021   Hyperlipidemia 05/24/2021   Overweight with body mass index (BMI) of 29 to 29.9 in adult 05/24/2021   Non-smoker 05/23/2021   Diabetes mellitus without complication (HCC) 01/21/2020   BACK PAIN, LUMBAR 05/29/2010   Essential hypertension 11/20/2009   Gastroesophageal reflux disease without esophagitis 11/20/2009   CHEST PAIN UNSPECIFIED 11/20/2009    Past Surgical History:  Procedure Laterality Date   BIOPSY  09/04/2022   Procedure: BIOPSY;  Surgeon: Robert Fallow, MD;  Location: [facility];  Service: Gastroenterology;;   ESOPHAGOGASTRODUODENOSCOPY N/A 09/04/2022   Procedure: ESOPHAGOGASTRODUODENOSCOPY (EGD);  Surgeon: Robert Fallow, MD;  Location: [facility];  Service: Gastroenterology;  Laterality: N/A;   NO PAST SURGERIES         Home Medications    Prior to Admission medications   Medication Sig Start Date End Date Taking? Authorizing Provider  atorvastatin  (LIPITOR) 20 MG tablet Take 1 tablet (20 mg total) by mouth daily. 08/27/24   Robert Mini, NP  lisinopril -hydrochlorothiazide   (ZESTORETIC ) 20-12.5 MG tablet Take 1 tablet by mouth daily. For blood pressure 08/27/24   Robert Mini, NP    Family History Family History  Problem Relation Age of Onset   Hypertension Mother    Hypertension Father    Parkinsonism Father    Cancer Neg Hx    COPD Neg Hx    Depression Neg Hx    Hyperlipidemia Neg Hx    Kidney disease Neg Hx    Stroke Neg Hx     Social History Social History   Tobacco Use   Smoking status: Never   Smokeless tobacco: Never  Vaping Use   Vaping status: Never Used  Substance Use Topics   Alcohol use: Not Currently   Drug use: No     Allergies   Latex   Review of Systems Review of Systems  See HPI Physical Exam Triage Vital Signs ED Triage Vitals  Encounter Vitals Group     BP 11/10/24 1845 (!) 167/92     Girls Systolic BP Percentile --      Girls Diastolic BP Percentile --      Boys Systolic BP Percentile --      Boys Diastolic BP Percentile --      Pulse Rate 11/10/24 1845 92     Resp --      Temp 11/10/24 1845 98 F (36.7 C)     Temp Source 11/10/24 1845  Temporal     SpO2 11/10/24 1845 98 %     Weight --      Height --      Head Circumference --      Peak Flow --      Pain Score 11/10/24 1844 0     Pain Loc --      Pain Education --      Exclude from Growth Chart --    No data found.  Updated Vital Signs BP (!) 167/92 (BP Location: Right Arm) Comment: states always high at dr  Pulse 92   Temp 98 F (36.7 C) (Temporal)   SpO2 98%      Physical Exam Constitutional:      General: He is not in acute distress.    Appearance: Normal appearance. He is well-developed and normal weight.     Comments: Concerned about health  HENT:     Head: Normocephalic and atraumatic.  Eyes:     Conjunctiva/sclera: Conjunctivae normal.     Pupils: Pupils are equal, round, and reactive to light.  Cardiovascular:     Rate and Rhythm: Normal rate.  Pulmonary:     Effort: Pulmonary effort is normal. No respiratory distress.   Musculoskeletal:        General: Normal range of motion.     Cervical back: Normal range of motion.  Skin:    General: Skin is warm and dry.  Neurological:     Mental Status: He is alert.      UC Treatments / Results  Labs (all labs ordered are listed, but only abnormal results are displayed) Labs Reviewed  POCT URINALYSIS DIP (MANUAL ENTRY) - Abnormal; Notable for the following components:      Result Value   Color, UA light yellow (*)    Ketones, POC UA trace (5) (*)    Spec Grav, UA <=1.005 (*)    All other components within normal limits    EKG   Radiology No results found.  Procedures Procedures (including critical care time)  Medications Ordered in UC Medications - No data to display  Initial Impression / Assessment and Plan / UC Course  I have reviewed the triage vital signs and the nursing notes.  Pertinent labs & imaging results that were available during my care of the patient were reviewed by me and considered in my medical decision making (see chart for details).     I explained that urinalysis is noncontributory.  Dysuria can be from a variety of sources.  Follow-up with PCP Final Clinical Impressions(s) / UC Diagnoses   Final diagnoses:  Dysuria     Discharge Instructions      No abnormality found on urinalysis Follow-up with your primary care doctor   ED Prescriptions   None    PDMP not reviewed this encounter.   Robert Jamee Jacob, MD 11/10/24 Robert Glover

## 2024-11-10 NOTE — ED Triage Notes (Signed)
 Pt c/o pain at the end of urination. States there is a burning sensation and he feels that he is urinating more frequently. States this has happened over the last week but worse today.

## 2024-11-19 ENCOUNTER — Ambulatory Visit: Admitting: Medical-Surgical

## 2024-11-19 ENCOUNTER — Encounter: Payer: Self-pay | Admitting: Medical-Surgical

## 2024-11-19 VITALS — BP 135/76 | HR 78 | Ht 68.0 in | Wt 168.0 lb

## 2024-11-19 DIAGNOSIS — R300 Dysuria: Secondary | ICD-10-CM

## 2024-11-19 DIAGNOSIS — I10 Essential (primary) hypertension: Secondary | ICD-10-CM | POA: Diagnosis not present

## 2024-11-19 DIAGNOSIS — E782 Mixed hyperlipidemia: Secondary | ICD-10-CM | POA: Diagnosis not present

## 2024-11-19 DIAGNOSIS — E119 Type 2 diabetes mellitus without complications: Secondary | ICD-10-CM | POA: Diagnosis not present

## 2024-11-19 DIAGNOSIS — Z87438 Personal history of other diseases of male genital organs: Secondary | ICD-10-CM

## 2024-11-19 LAB — POCT UA - MICROALBUMIN
Albumin/Creatinine Ratio, Urine, POC: <30
Creatinine, POC: 10 mg/dL
Microalbumin Ur, POC: 10 mg/L

## 2024-11-19 LAB — POCT URINALYSIS DIP (CLINITEK)
Bilirubin, UA: NEGATIVE
Blood, UA: NEGATIVE
Glucose, UA: NEGATIVE mg/dL
Ketones, POC UA: NEGATIVE mg/dL
Leukocytes, UA: NEGATIVE
Nitrite, UA: NEGATIVE
POC PROTEIN,UA: NEGATIVE
Spec Grav, UA: <=1.005 — AB (ref 1.010–1.025)
Urobilinogen, UA: 0.2 U/dL
pH, UA: 5.5 (ref 5.0–8.0)

## 2024-11-19 MED ORDER — ATORVASTATIN CALCIUM 20 MG PO TABS: 20.0000 mg | ORAL_TABLET | Freq: Every day | ORAL | 3 refills | Status: AC

## 2024-11-19 MED ORDER — LISINOPRIL-HYDROCHLOROTHIAZIDE 20-12.5 MG PO TABS: 1.0000 | ORAL_TABLET | Freq: Every day | ORAL | 3 refills | Status: AC

## 2024-11-19 NOTE — Progress Notes (Unsigned)
 Established patient visit   History of Present Illness   Discussed the use of AI scribe software for clinical note transcription with the patient, who gave verbal consent to proceed.  History of Present Illness   Robert Glover is a 48 year old male who presents for a follow-up visit.  Exertional chest pain and gastroesophageal reflux symptoms - Chest pain occurs during running, attributed to acid reflux. - Symptoms are alleviated by taking Tums prior to running. - No current chest pain. - No reflux symptoms at other times.  Urethral burning sensation - Intermittent burning sensation at the tip of the urethra after urination, lasting approximately 30 minutes. - Occasional urge to urinate without need accompanying the burning. - Symptoms have resolved since switching back to previous brand of underwear. - No back pain or testicular swelling. - Recent urine test was normal.  Cardiometabolic health and lifestyle management - Actively managing weight and exercise routine, with recent weight loss. - Engages in a structured exercise program and is gradually increasing running intensity. - Home blood pressure monitoring shows a recent reading of 128/72 mmHg. - Takes lisinopril , hydrochlorothiazide , and atorvastatin  without side effects. - Blood glucose levels range between 92 and 105 mg/dL. - Maintains good dietary habits with occasional indulgences.       Physical Exam   Physical Exam Vitals and nursing note reviewed.  Constitutional:      General: He is not in acute distress.    Appearance: Normal appearance. He is not ill-appearing.  HENT:     Head: Normocephalic and atraumatic.  Cardiovascular:     Rate and Rhythm: Normal rate and regular rhythm.     Pulses: Normal pulses.     Heart sounds: Normal heart sounds. No murmur heard.    No friction rub. No gallop.  Pulmonary:     Effort: Pulmonary effort is normal. No respiratory distress.     Breath  sounds: Normal breath sounds.  Skin:    General: Skin is warm and dry.  Neurological:     Mental Status: He is alert and oriented to person, place, and time.  Psychiatric:        Mood and Affect: Mood normal.        Behavior: Behavior normal.        Thought Content: Thought content normal.        Judgment: Judgment normal.    Assessment & Plan   Problem List Items Addressed This Visit       Cardiovascular and Mediastinum   Essential hypertension   Blood pressure well-controlled with lisinopril  and hydrochlorothiazide . Home readings satisfactory. - Continue lisinopril -hydrochlorothiazide  20-12.5mg  daily. - Monitor blood pressure at home regularly.      Relevant Medications   atorvastatin  (LIPITOR) 20 MG tablet   lisinopril -hydrochlorothiazide  (ZESTORETIC ) 20-12.5 MG tablet   Other Relevant Orders   CBC (Completed)   Lipid panel (Completed)   Comprehensive metabolic panel with GFR     Endocrine   Diabetes mellitus without complication (HCC) - Primary   Diabetes well-controlled with A1c of 5.6 and home glucose 92-105 mg/dL. - Continue dietary and lifestyle management. - Monitor blood glucose levels at home regularly.      Relevant Medications   atorvastatin  (LIPITOR) 20 MG tablet   lisinopril -hydrochlorothiazide  (ZESTORETIC ) 20-12.5 MG tablet   Other Relevant Orders   POCT UA - Microalbumin (Completed)     Other   Hyperlipidemia   Cholesterol levels satisfactory with atorvastatin .  - Continue  atorvastatin  20mg  daily. - Continue regular intentional exercise and low fat heart healthy diet. - Rechecked metabolic panel and kidney function today.      Relevant Medications   atorvastatin  (LIPITOR) 20 MG tablet   lisinopril -hydrochlorothiazide  (ZESTORETIC ) 20-12.5 MG tablet   Other Relevant Orders   Lipid panel (Completed)   Comprehensive metabolic panel with GFR   Other Visit Diagnoses       Dysuria       Relevant Orders   POCT URINALYSIS DIP (CLINITEK)  (Completed)   Urine Culture      Dysuria Intermittent tingling/burning at the tip of the penis after voiding lasting up to 30 minutes with no other symptoms. Etiology uncertain but possibly related to a recent change in underwear type/brand.  - POCT UA normal. - Switch back to previous undergarment type/brand.  - Monitor for recurrence of symptoms and return for further evaluation as needed.   General Health Maintenance Actively engaged in weight loss and exercise, reduced weight to 159 pounds. - Continue current exercise and weight loss regimen.        Follow up   Return in about 4 months (around 03/19/2025) for DM follow up. __________________________________ Zada FREDRIK Palin, DNP, APRN, FNP-BC Primary Care and Sports Medicine [facility] [place]

## 2024-11-20 ENCOUNTER — Ambulatory Visit: Payer: Self-pay | Admitting: Medical-Surgical

## 2024-11-20 LAB — COMPREHENSIVE METABOLIC PANEL WITH GFR
ALT: 25 IU/L (ref 0–44)
AST: 24 IU/L (ref 0–40)
Albumin: 4.5 g/dL (ref 4.1–5.1)
Alkaline Phosphatase: 65 IU/L (ref 47–123)
BUN/Creatinine Ratio: 18 (ref 9–20)
BUN: 20 mg/dL (ref 6–24)
Bilirubin Total: 0.7 mg/dL (ref 0.0–1.2)
CO2: 24 mmol/L (ref 20–29)
Calcium: 9.5 mg/dL (ref 8.7–10.2)
Chloride: 101 mmol/L (ref 96–106)
Creatinine, Ser: 1.11 mg/dL (ref 0.76–1.27)
Globulin, Total: 2.2 g/dL (ref 1.5–4.5)
Glucose: 80 mg/dL (ref 70–99)
Potassium: 4.2 mmol/L (ref 3.5–5.2)
Sodium: 139 mmol/L (ref 134–144)
Total Protein: 6.7 g/dL (ref 6.0–8.5)
eGFR: 82 mL/min/1.73 (ref >59)

## 2024-11-20 LAB — CBC
Hematocrit: 46.7 % (ref 37.5–51.0)
Hemoglobin: 15.9 g/dL (ref 13.0–17.7)
MCH: 30.1 pg (ref 26.6–33.0)
MCHC: 34 g/dL (ref 31.5–35.7)
MCV: 88 fL (ref 79–97)
Platelets: 278 x10E3/uL (ref 150–450)
RBC: 5.29 x10E6/uL (ref 4.14–5.80)
RDW: 13.3 % (ref 11.6–15.4)
WBC: 6.4 x10E3/uL (ref 3.4–10.8)

## 2024-11-20 LAB — LIPID PANEL
Chol/HDL Ratio: 2.9 ratio (ref 0.0–5.0)
Cholesterol, Total: 136 mg/dL (ref 100–199)
HDL: 47 mg/dL (ref >39)
LDL Chol Calc (NIH): 67 mg/dL (ref 0–99)
Triglycerides: 125 mg/dL (ref 0–149)
VLDL Cholesterol Cal: 22 mg/dL (ref 5–40)

## 2024-11-20 NOTE — Assessment & Plan Note (Signed)
 Cholesterol levels satisfactory with atorvastatin .  - Continue atorvastatin  20mg  daily. - Continue regular intentional exercise and low fat heart healthy diet. - Rechecked metabolic panel and kidney function today.

## 2024-11-20 NOTE — Assessment & Plan Note (Signed)
 Diabetes well-controlled with A1c of 5.6 and home glucose 92-105 mg/dL. - Continue dietary and lifestyle management. - Monitor blood glucose levels at home regularly.

## 2024-11-20 NOTE — Assessment & Plan Note (Signed)
 Blood pressure well-controlled with lisinopril  and hydrochlorothiazide . Home readings satisfactory. - Continue lisinopril -hydrochlorothiazide  20-12.5mg  daily. - Monitor blood pressure at home regularly.

## 2024-11-21 LAB — URINE CULTURE: Organism ID, Bacteria: NO GROWTH

## 2024-12-03 ENCOUNTER — Encounter: Payer: Self-pay | Admitting: Medical-Surgical

## 2024-12-03 DIAGNOSIS — Z1211 Encounter for screening for malignant neoplasm of colon: Secondary | ICD-10-CM

## 2025-01-04 ENCOUNTER — Encounter: Payer: Self-pay | Admitting: Physician Assistant

## 2025-02-04 NOTE — Progress Notes (Unsigned)
 "    02/04/2025 Robert Glover [id number] 1976/05/30  Referring provider: Willo Mini, NP Primary GI doctor: {[email or web address]:[id number]}  ASSESSMENT AND PLAN:  GERD with history of esophageal stricture status post dilatation 09/04/2022 EGD at [facility] Dr. Toy stricture at GE junction, negative EOE  FH colon cancer (mother passed at age 49 colorectal cancer)  Patient Care Team: Willo Mini, NP as PCP - General (Nurse Practitioner)  HISTORY OF PRESENT ILLNESS: 49 y.o. male with a past medical history listed below presents for evaluation of ***.   Previously seen by [facility] May 2024 for reflux with dysphagia.  *** Discussed the use of AI scribe software for clinical note transcription with the patient, who gave verbal consent to proceed.  History of Present Illness            He  reports that he has never smoked. He has never used smokeless tobacco. He reports that he does not currently use alcohol. He reports that he does not use drugs.  RELEVANT GI HISTORY, IMAGING AND LABS: Results          CBC    Component Value Date/Time   WBC 6.4 11/19/2024 1606   WBC 6.2 01/21/2023 1525   RBC 5.29 11/19/2024 1606   RBC 5.35 01/21/2023 1525   HGB 15.9 11/19/2024 1606   HCT 46.7 11/19/2024 1606   PLT 278 11/19/2024 1606   MCV 88 11/19/2024 1606   MCH 30.1 11/19/2024 1606   MCH 30.5 01/21/2023 1525   MCHC 34.0 11/19/2024 1606   MCHC 35.6 01/21/2023 1525   RDW 13.3 11/19/2024 1606   LYMPHSABS 1,724 01/21/2023 1525   MONOABS 0.6 03/09/2021 1659   EOSABS 651 (H) 01/21/2023 1525   BASOSABS 81 01/21/2023 1525   Recent Labs    11/19/24 1606  HGB 15.9    CMP     Component Value Date/Time   NA 139 11/19/2024 1606   K 4.2 11/19/2024 1606   CL 101 11/19/2024 1606   CO2 24 11/19/2024 1606   GLUCOSE 80 11/19/2024 1606   GLUCOSE 121 (H) 01/21/2023 1525   BUN 20 11/19/2024 1606   CREATININE 1.11 11/19/2024 1606   CREATININE 1.12 01/21/2023 1525   CALCIUM  9.5 11/19/2024 1606    PROT 6.7 11/19/2024 1606   ALBUMIN 4.5 11/19/2024 1606   AST 24 11/19/2024 1606   ALT 25 11/19/2024 1606   ALKPHOS 65 11/19/2024 1606   BILITOT 0.7 11/19/2024 1606   GFRNONAA >60 03/09/2021 1659   GFRNONAA 76 10/02/2020 1433   GFRAA 88 10/02/2020 1433      Latest Ref Rng & Units 11/19/2024    4:06 PM 01/21/2023    3:25 PM 04/08/2022   12:00 AM  Hepatic Function  Total Protein 6.0 - 8.5 g/dL 6.7  7.4  7.7   Albumin 4.1 - 5.1 g/dL 4.5     AST 0 - 40 IU/L 24  19  20    ALT 0 - 44 IU/L 25  34  27   Alk Phosphatase 47 - 123 IU/L 65     Total Bilirubin 0.0 - 1.2 mg/dL 0.7  0.7  0.8       Current Medications:   Current Outpatient Medications (Cardiovascular):    atorvastatin  (LIPITOR) 20 MG tablet, Take 1 tablet (20 mg total) by mouth daily.   lisinopril -hydrochlorothiazide  (ZESTORETIC ) 20-12.5 MG tablet, Take 1 tablet by mouth daily. For blood pressure  Medical History:  Past Medical History:  Diagnosis Date   COVID-19  Diabetes mellitus without complication (HCC)    Pre-hypertension    Reflux    Allergies: Allergies[1]   Surgical History:  He  has a past surgical history that includes No past surgeries; Esophagogastroduodenoscopy (N/A, 09/04/2022); and biopsy (09/04/2022). Family History:  His family history includes Hypertension in his father and mother; Parkinsonism in his father.  REVIEW OF SYSTEMS  : All other systems reviewed and negative except where noted in the History of Present Illness.  PHYSICAL EXAM: There were no vitals taken for this visit. Physical Exam          Alan JONELLE Coombs, PA-C 11:12 AM      [1]  Allergies Allergen Reactions   Latex Rash   "

## 2025-02-07 ENCOUNTER — Ambulatory Visit: Admitting: Physician Assistant

## 2025-03-25 ENCOUNTER — Ambulatory Visit: Admitting: Medical-Surgical
